# Patient Record
Sex: Female | Born: 1935 | Race: Black or African American | Hispanic: No | Smoking: Former smoker
Health system: Southern US, Community
[De-identification: ages and names within clinical notes are randomized; demographics above are authoritative.]

## PROBLEM LIST (undated history)

## (undated) DIAGNOSIS — I10 Essential (primary) hypertension: Secondary | ICD-10-CM

## (undated) DIAGNOSIS — H25019 Cortical age-related cataract, unspecified eye: Secondary | ICD-10-CM

## (undated) DIAGNOSIS — R2681 Unsteadiness on feet: Secondary | ICD-10-CM

## (undated) DIAGNOSIS — Z860100 Personal history of colon polyps, unspecified: Secondary | ICD-10-CM

## (undated) DIAGNOSIS — C140 Malignant neoplasm of pharynx, unspecified: Secondary | ICD-10-CM

## (undated) DIAGNOSIS — H9193 Unspecified hearing loss, bilateral: Secondary | ICD-10-CM

## (undated) DIAGNOSIS — M1990 Unspecified osteoarthritis, unspecified site: Secondary | ICD-10-CM

## (undated) DIAGNOSIS — I2510 Atherosclerotic heart disease of native coronary artery without angina pectoris: Secondary | ICD-10-CM

## (undated) DIAGNOSIS — D649 Anemia, unspecified: Secondary | ICD-10-CM

## (undated) DIAGNOSIS — D751 Secondary polycythemia: Secondary | ICD-10-CM

## (undated) HISTORY — DX: Malignant neoplasm of pharynx, unspecified: C14.0

## (undated) HISTORY — DX: Secondary polycythemia: D75.1

## (undated) HISTORY — DX: Anemia, unspecified: D64.9

## (undated) HISTORY — DX: Essential (primary) hypertension: I10

## (undated) HISTORY — PX: JOINT REPLACEMENT: SHX530

## (undated) HISTORY — DX: Unspecified hearing loss, bilateral: H91.93

## (undated) HISTORY — DX: Unspecified osteoarthritis, unspecified site: M19.90

## (undated) HISTORY — DX: Personal history of colonic polyps: Z86.010

## (undated) HISTORY — DX: Unsteadiness on feet: R26.81

## (undated) HISTORY — DX: Atherosclerotic heart disease of native coronary artery without angina pectoris: I25.10

## (undated) HISTORY — DX: Cortical age-related cataract, unspecified eye: H25.019

## (undated) HISTORY — DX: Personal history of colon polyps, unspecified: Z86.0100

---

## 2003-09-17 DIAGNOSIS — C140 Malignant neoplasm of pharynx, unspecified: Secondary | ICD-10-CM

## 2003-09-17 HISTORY — DX: Malignant neoplasm of pharynx, unspecified: C14.0

## 2004-06-16 ENCOUNTER — Ambulatory Visit: Payer: Self-pay | Admitting: Radiation Oncology

## 2006-05-28 ENCOUNTER — Other Ambulatory Visit: Payer: Self-pay

## 2006-05-28 ENCOUNTER — Emergency Department: Payer: Self-pay | Admitting: Emergency Medicine

## 2007-06-19 ENCOUNTER — Ambulatory Visit: Payer: Self-pay | Admitting: Otolaryngology

## 2007-06-24 ENCOUNTER — Ambulatory Visit: Payer: Self-pay | Admitting: Otolaryngology

## 2012-09-22 ENCOUNTER — Ambulatory Visit: Payer: Self-pay | Admitting: Otolaryngology

## 2012-12-18 ENCOUNTER — Ambulatory Visit: Payer: Self-pay | Admitting: Family Medicine

## 2012-12-31 ENCOUNTER — Ambulatory Visit: Payer: Self-pay | Admitting: Family Medicine

## 2013-07-20 ENCOUNTER — Ambulatory Visit: Payer: Self-pay | Admitting: Surgery

## 2014-04-06 ENCOUNTER — Emergency Department: Payer: Self-pay | Admitting: Emergency Medicine

## 2014-04-06 LAB — BASIC METABOLIC PANEL
Anion Gap: 9 (ref 7–16)
BUN: 9 mg/dL (ref 7–18)
CHLORIDE: 102 mmol/L (ref 98–107)
Calcium, Total: 8.3 mg/dL — ABNORMAL LOW (ref 8.5–10.1)
Co2: 28 mmol/L (ref 21–32)
Creatinine: 0.84 mg/dL (ref 0.60–1.30)
EGFR (Non-African Amer.): >60
GLUCOSE: 114 mg/dL — AB (ref 65–99)
OSMOLALITY: 277 (ref 275–301)
Potassium: 3.2 mmol/L — ABNORMAL LOW (ref 3.5–5.1)
Sodium: 139 mmol/L (ref 136–145)

## 2014-04-06 LAB — CBC
HCT: 44.4 % (ref 35.0–47.0)
HGB: 14.3 g/dL (ref 12.0–16.0)
MCH: 30 pg (ref 26.0–34.0)
MCHC: 32.1 g/dL (ref 32.0–36.0)
MCV: 93 fL (ref 80–100)
Platelet: 191 10*3/uL (ref 150–440)
RBC: 4.76 10*6/uL (ref 3.80–5.20)
RDW: 14.9 % — AB (ref 11.5–14.5)
WBC: 7.1 10*3/uL (ref 3.6–11.0)

## 2014-04-06 LAB — TROPONIN I: Troponin-I: <0.02 ng/mL

## 2015-06-28 ENCOUNTER — Encounter: Payer: Self-pay | Admitting: *Deleted

## 2015-06-29 ENCOUNTER — Encounter: Payer: Self-pay | Admitting: *Deleted

## 2015-06-29 ENCOUNTER — Inpatient Hospital Stay: Payer: Medicare Other

## 2015-06-29 ENCOUNTER — Inpatient Hospital Stay: Payer: Medicare Other | Attending: Internal Medicine | Admitting: Internal Medicine

## 2015-06-29 VITALS — BP 141/83 | HR 80 | Ht 63.0 in | Wt 122.4 lb

## 2015-06-29 DIAGNOSIS — D751 Secondary polycythemia: Secondary | ICD-10-CM | POA: Insufficient documentation

## 2015-06-29 DIAGNOSIS — Z7982 Long term (current) use of aspirin: Secondary | ICD-10-CM | POA: Insufficient documentation

## 2015-06-29 DIAGNOSIS — Z85819 Personal history of malignant neoplasm of unspecified site of lip, oral cavity, and pharynx: Secondary | ICD-10-CM | POA: Diagnosis not present

## 2015-06-29 DIAGNOSIS — Z86010 Personal history of colonic polyps: Secondary | ICD-10-CM | POA: Insufficient documentation

## 2015-06-29 DIAGNOSIS — I2510 Atherosclerotic heart disease of native coronary artery without angina pectoris: Secondary | ICD-10-CM | POA: Insufficient documentation

## 2015-06-29 DIAGNOSIS — I10 Essential (primary) hypertension: Secondary | ICD-10-CM | POA: Diagnosis not present

## 2015-06-29 DIAGNOSIS — Z79899 Other long term (current) drug therapy: Secondary | ICD-10-CM | POA: Diagnosis not present

## 2015-06-29 LAB — CBC WITH DIFFERENTIAL/PLATELET
BASOS ABS: 0.1 10*3/uL (ref 0–0.1)
BASOS PCT: 1 %
EOS PCT: 1 %
Eosinophils Absolute: 0 10*3/uL (ref 0–0.7)
HCT: 51.5 % — ABNORMAL HIGH (ref 35.0–47.0)
Hemoglobin: 17.3 g/dL — ABNORMAL HIGH (ref 12.0–16.0)
LYMPHS PCT: 13 %
Lymphs Abs: 1 10*3/uL (ref 1.0–3.6)
MCH: 31.1 pg (ref 26.0–34.0)
MCHC: 33.6 g/dL (ref 32.0–36.0)
MCV: 92.7 fL (ref 80.0–100.0)
MONO ABS: 0.5 10*3/uL (ref 0.2–0.9)
Monocytes Relative: 6 %
NEUTROS ABS: 6.1 10*3/uL (ref 1.4–6.5)
Neutrophils Relative %: 79 %
PLATELETS: 177 10*3/uL (ref 150–440)
RBC: 5.55 MIL/uL — AB (ref 3.80–5.20)
RDW: 14.7 % — AB (ref 11.5–14.5)
WBC: 7.6 10*3/uL (ref 3.6–11.0)

## 2015-06-29 LAB — COMPREHENSIVE METABOLIC PANEL
ALBUMIN: 3.7 g/dL (ref 3.5–5.0)
ALT: 11 U/L — AB (ref 14–54)
AST: 19 U/L (ref 15–41)
Alkaline Phosphatase: 49 U/L (ref 38–126)
Anion gap: 6 (ref 5–15)
BUN: 8 mg/dL (ref 6–20)
CHLORIDE: 100 mmol/L — AB (ref 101–111)
CO2: 29 mmol/L (ref 22–32)
CREATININE: 0.8 mg/dL (ref 0.44–1.00)
Calcium: 8.2 mg/dL — ABNORMAL LOW (ref 8.9–10.3)
GFR calc Af Amer: >60 mL/min (ref >60)
GFR calc non Af Amer: >60 mL/min (ref >60)
Glucose, Bld: 99 mg/dL (ref 65–99)
POTASSIUM: 3.4 mmol/L — AB (ref 3.5–5.1)
Sodium: 135 mmol/L (ref 135–145)
Total Bilirubin: 2 mg/dL — ABNORMAL HIGH (ref 0.3–1.2)
Total Protein: 7.1 g/dL (ref 6.5–8.1)

## 2015-06-29 LAB — LACTATE DEHYDROGENASE: LDH: 187 U/L (ref 98–192)

## 2015-06-29 NOTE — Progress Notes (Signed)
Patient has no complaints except urinary frequency.

## 2015-06-29 NOTE — Progress Notes (Signed)
[facility] CONSULT NOTE  Patient Care Team: [name], MD as PCP - General (Family Medicine)  CHIEF COMPLAINTS/PURPOSE OF CONSULTATION:  Polycythemia  HISTORY OF PRESENTING ILLNESS:  [name] 81 y.o.  female with a history of arthritis; prior history of throat cancer 2005 who has limited mobility has been referred to [place] for further evaluation of elevated hemoglobin/hematocrit noted incidentally on labs.  Patient is fair historian at best given her age. Her daughter gives most of the history.  Patient does not have any unusual headaches no unusual vision changes or symptoms of congestive heart failure etc. Patient never had any blood clots or strokes in the past. As per the daughter she does not snore/does not have symptoms of SLEEP apnea. She currently does not smoke.  ROS: A complete 10 point review of system is done which is negative except mentioned above in history of present illness  MEDICAL HISTORY:  Past Medical History  Diagnosis Date  . Anemia   . Arthritis   . Cataract cortical, senile   . CVD (cardiovascular disease)   . Hearing loss of both ears     wears hearing aides  . Hypertension   . History of colonic polyps   . Throat cancer ([place]) 2005  . Unsteady gait     SURGICAL HISTORY: Past Surgical History  Procedure Laterality Date  . Joint replacement Left     11-12 years ago secondary to some from children    SOCIAL HISTORY: Social History   Social History  . Marital Status: Widowed    Spouse Name: N/A  . Number of Children: N/A  . Years of Education: N/A   Occupational History  . Not on file.   Social History Main Topics  . Smoking status: Not on file  . Smokeless tobacco: Not on file  . Alcohol Use: Not on file  . Drug Use: Not on file  . Sexual Activity: Not on file   Other Topics Concern  . Not on file   Social History Narrative    FAMILY HISTORY: Family History  Problem Relation Age of Onset  . Hypertension  Mother   . Hypertension Other   . Diabetes Mellitus II    . Cancer      ALLERGIES:  is allergic to atorvastatin.  MEDICATIONS:  Current Outpatient Prescriptions  Medication Sig Dispense Refill  . amLODipine (NORVASC) 10 MG tablet Take by mouth.    Marland Kitchen aspirin EC 325 MG tablet Take by mouth.    . hydrochlorothiazide (HYDRODIURIL) 25 MG tablet Take by mouth.    . pravastatin (PRAVACHOL) 40 MG tablet Take by mouth.     No current facility-administered medications for this visit.      Marland Kitchen  PHYSICAL EXAMINATION: ECOG PERFORMANCE STATUS: 0 - Asymptomatic  Filed Vitals:   06/29/15 0902  BP: 141/83  Pulse: 80   Filed Weights   06/29/15 0902  Weight: 122 lb 5.7 oz (55.5 kg)    GENERAL: Well-nourished well-developed; Alert, no distress and comfortable.  Accompanied by her daughter. She is in a wheelchair. She was examined in the future hence exams limited. EYES: no pallor or icterus OROPHARYNX: poor dentition. NECK: supple, no masses felt LYMPH:  no palpable lymphadenopathy in the cervical, axillary or inguinal regions LUNGS: clear to auscultation and  No wheeze or crackles HEART/CVS: regular rate & rhythm and no murmurs; No lower extremity edema ABDOMEN: abdomen soft, non-tender and normal bowel sounds Musculoskeletal:no cyanosis of digits and no clubbing  PSYCH: alert & oriented x 3 with fluent speech NEURO: no focal motor/sensory deficits SKIN:  no rashes or significant lesions  LABORATORY DATA:  I have reviewed the data as listed Lab Results  Component Value Date   WBC 7.1 04/06/2014   HGB 14.3 04/06/2014   HCT 44.4 04/06/2014   MCV 93 04/06/2014   PLT 191 04/06/2014   No results for input(s): NA, K, CL, CO2, GLUCOSE, BUN, CREATININE, CALCIUM, GFRNONAA, GFRAA, PROT, ALBUMIN, AST, ALT, ALKPHOS, BILITOT, BILIDIR, IBILI in the last 8760 hours.  RADIOGRAPHIC STUDIES: I have personally reviewed the radiological images as listed and agreed with the findings in the  report. No results found.  ASSESSMENT & PLAN:   # Polycythemia- hemoglobin 17/hematocrit 51. Asymptomatic at this time. .Check CBC CMP and LDH; check jack 2 mutation; erythropoietin levels. Discussed with the patient/daughter that significant elevated hematocrit hemoglobin can cause strokes/congestive heart failure; which tends to happen mostly in primary polycythemia vera. Also discussed the phlebotomy would be recommended if she continues to have elevated hemoglobin/hematocrit.  All questions were answered. The patient knows to call the clinic with any problems, questions or concerns.   I spent 30 minutes counseling the patient face to face. The total time spent in the appointment was 40 minutes and more than 50% was on counseling.     Cammie Sickle, MD 06/29/2015 9:20 AM

## 2015-07-13 LAB — JAK2  V617F QUAL. WITH REFLEX TO EXON 12

## 2015-07-13 LAB — JAK2 EXON 12 MUTATION ANALYSIS

## 2015-07-27 ENCOUNTER — Other Ambulatory Visit: Payer: Self-pay | Admitting: *Deleted

## 2015-07-27 ENCOUNTER — Inpatient Hospital Stay: Payer: Medicare Other

## 2015-07-27 ENCOUNTER — Inpatient Hospital Stay (HOSPITAL_BASED_OUTPATIENT_CLINIC_OR_DEPARTMENT_OTHER): Payer: Medicare Other | Admitting: Internal Medicine

## 2015-07-27 ENCOUNTER — Encounter: Payer: Self-pay | Admitting: Internal Medicine

## 2015-07-27 ENCOUNTER — Inpatient Hospital Stay: Payer: Medicare Other | Attending: Internal Medicine

## 2015-07-27 VITALS — BP 128/82 | HR 66 | Temp 96.0°F | Resp 18 | Ht 63.0 in | Wt 122.1 lb

## 2015-07-27 DIAGNOSIS — Z86010 Personal history of colonic polyps: Secondary | ICD-10-CM | POA: Insufficient documentation

## 2015-07-27 DIAGNOSIS — Z79899 Other long term (current) drug therapy: Secondary | ICD-10-CM

## 2015-07-27 DIAGNOSIS — Z85819 Personal history of malignant neoplasm of unspecified site of lip, oral cavity, and pharynx: Secondary | ICD-10-CM

## 2015-07-27 DIAGNOSIS — D751 Secondary polycythemia: Secondary | ICD-10-CM

## 2015-07-27 DIAGNOSIS — I2510 Atherosclerotic heart disease of native coronary artery without angina pectoris: Secondary | ICD-10-CM | POA: Diagnosis not present

## 2015-07-27 DIAGNOSIS — Z7982 Long term (current) use of aspirin: Secondary | ICD-10-CM | POA: Insufficient documentation

## 2015-07-27 DIAGNOSIS — M1990 Unspecified osteoarthritis, unspecified site: Secondary | ICD-10-CM | POA: Insufficient documentation

## 2015-07-27 DIAGNOSIS — Z87891 Personal history of nicotine dependence: Secondary | ICD-10-CM

## 2015-07-27 DIAGNOSIS — D45 Polycythemia vera: Secondary | ICD-10-CM | POA: Insufficient documentation

## 2015-07-27 DIAGNOSIS — I10 Essential (primary) hypertension: Secondary | ICD-10-CM | POA: Insufficient documentation

## 2015-07-27 NOTE — Progress Notes (Signed)
She has not had a stroke or TIA since her last visit.  She is HOH and daughter has to speak to her.

## 2015-07-27 NOTE — Progress Notes (Signed)
[facility] OFFICE PROGRESS NOTE  Patient Care Team: [name] Pink, MD as PCP - General (Family Medicine)   SUMMARY OF ONCOLOGIC HISTORY:  # OCT 2016- ERYTHROCYTOSIS- [Hb 17 & hct-51; incidental] JAK-2 exon 14 & 12-NEG  # Hard of hearing/debilitating arthritis wheelchair-bound   INTERVAL HISTORY: Given her age/hard of hearing- patient is a poor historian   81 year old female patient with above history of erythrocytosis is here accompanied by her daughter to review the lab results further workup/management.  As per the daughter no new symptoms since last visit.  REVIEW OF SYSTEMS:   cannot be assessed because of patient being a poor historian.   PAST MEDICAL HISTORY :  Past Medical History  Diagnosis Date  . Anemia   . Arthritis   . Cataract cortical, senile   . CVD (cardiovascular disease)   . Hearing loss of both ears     wears hearing aides  . Hypertension   . History of colonic polyps   . Throat cancer ([place]) 2005  . Unsteady gait   . Polycythemia     PAST SURGICAL HISTORY :   Past Surgical History  Procedure Laterality Date  . Joint replacement Left     11-12 years ago secondary to some from children    FAMILY HISTORY :   Family History  Problem Relation Age of Onset  . Hypertension Mother   . Hypertension Sister   . Diabetes Mellitus II Sister     SOCIAL HISTORY:   Social History  Substance Use Topics  . Smoking status: Former Smoker -- 1.00 packs/day for 50 years    Types: Cigarettes    Quit date: 09/17/2003  . Smokeless tobacco: Current User    Types: Snuff  . Alcohol Use: 0.0 oz/week    0 Standard drinks or equivalent per week     Comment: "beer"    ALLERGIES:  is allergic to atorvastatin.  MEDICATIONS:  Current Outpatient Prescriptions  Medication Sig Dispense Refill  . amLODipine (NORVASC) 10 MG tablet Take by mouth.    Marland Kitchen aspirin EC 325 MG tablet Take by mouth.    . hydrochlorothiazide (HYDRODIURIL) 25 MG tablet Take by  mouth.    . pravastatin (PRAVACHOL) 40 MG tablet Take by mouth.     No current facility-administered medications for this visit.    PHYSICAL EXAMINATION: ECOG PERFORMANCE STATUS: 2 - Symptomatic, <50% confined to bed  BP 128/82 mmHg  Pulse 66  Temp(Src) 96 F (35.6 C) (Tympanic)  Resp 18  Ht '5\' 3"'$  (1.6 m)  Wt 122 lb 2.2 oz (55.4 kg)  BMI 21.64 kg/m2  Filed Weights   07/27/15 1344  Weight: 122 lb 2.2 oz (55.4 kg)    GENERAL: Cachectic frail appearing female patient in a wheelchair. Accompanied by daughter.   LABORATORY DATA:  I have reviewed the data as listed    Component Value Date/Time   NA 135 06/29/2015 0955   NA 139 04/06/2014 1659   K 3.4* 06/29/2015 0955   K 3.2* 04/06/2014 1659   CL 100* 06/29/2015 0955   CL 102 04/06/2014 1659   CO2 29 06/29/2015 0955   CO2 28 04/06/2014 1659   GLUCOSE 99 06/29/2015 0955   GLUCOSE 114* 04/06/2014 1659   BUN 8 06/29/2015 0955   BUN 9 04/06/2014 1659   CREATININE 0.80 06/29/2015 0955   CREATININE 0.84 04/06/2014 1659   CALCIUM 8.2* 06/29/2015 0955   CALCIUM 8.3* 04/06/2014 1659   PROT 7.1 06/29/2015 0955  ALBUMIN 3.7 06/29/2015 0955   AST 19 06/29/2015 0955   ALT 11* 06/29/2015 0955   ALKPHOS 49 06/29/2015 0955   BILITOT 2.0* 06/29/2015 0955   GFRNONAA >60 06/29/2015 0955   GFRNONAA >60 04/06/2014 1659   GFRAA >60 06/29/2015 0955   GFRAA >60 04/06/2014 1659    No results found for: SPEP, UPEP  Lab Results  Component Value Date   WBC 7.6 06/29/2015   NEUTROABS 6.1 06/29/2015   HGB 17.3* 06/29/2015   HCT 51.5* 06/29/2015   MCV 92.7 06/29/2015   PLT 177 06/29/2015      Chemistry      Component Value Date/Time   NA 135 06/29/2015 0955   NA 139 04/06/2014 1659   K 3.4* 06/29/2015 0955   K 3.2* 04/06/2014 1659   CL 100* 06/29/2015 0955   CL 102 04/06/2014 1659   CO2 29 06/29/2015 0955   CO2 28 04/06/2014 1659   BUN 8 06/29/2015 0955   BUN 9 04/06/2014 1659   CREATININE 0.80 06/29/2015 0955    CREATININE 0.84 04/06/2014 1659      Component Value Date/Time   CALCIUM 8.2* 06/29/2015 0955   CALCIUM 8.3* 04/06/2014 1659   ALKPHOS 49 06/29/2015 0955   AST 19 06/29/2015 0955   ALT 11* 06/29/2015 0955   BILITOT 2.0* 06/29/2015 0955       RADIOGRAPHIC STUDIES: I have personally reviewed the radiological images as listed and agreed with the findings in the report. No results found.   ASSESSMENT & PLAN:   # ISOLATED ERYTHROCYTOSIS-/secondary erythrocytosis- question etiology. [name] 2 negative. Erythropoietin/Carbon monoxide pending. Patient's hemoglobin is 17 hematocrit 51 asymptomatic.  Patient is fairly nervous/reluctant coming to the cancer center frequently. After a long discussion with the patient's daughter//patient preference- it was decided not to proceed with any phlebotomy; or any other follow-ups in the [place]. I think at her advanced age/poor performance status I think is very reasonable.   Orders Placed This Encounter  Procedures  . Carbon Monoxide, Blood    Standing Status: Future     Number of Occurrences: 1     Standing Expiration Date: 07/26/2016   All questions were answered. The patient knows to call the clinic with any problems, questions or concerns. No barriers to learning was detected. I spent 15 minutes counseling the patient face to face. The total time spent in the appointment was 30 minutes and more than 50% was on counseling and review of test results     [name] Sickle, MD 07/27/2015 2:03 PM

## 2015-07-28 LAB — CARBON MONOXIDE, BLOOD (PERFORMED AT REF LAB): Carbon Monoxide, Blood: 3.7 % — ABNORMAL HIGH (ref 0.0–1.9)

## 2015-07-28 LAB — ERYTHROPOIETIN: Erythropoietin: 7.8 m[IU]/mL (ref 2.6–18.5)

## 2015-08-31 ENCOUNTER — Emergency Department
Admission: EM | Admit: 2015-08-31 | Discharge: 2015-08-31 | Disposition: A | Discharge status: Home / Self Care | Payer: Medicare Other | Attending: Emergency Medicine | Admitting: Emergency Medicine

## 2015-08-31 ENCOUNTER — Emergency Department: Payer: Medicare Other

## 2015-08-31 ENCOUNTER — Encounter: Payer: Self-pay | Admitting: *Deleted

## 2015-08-31 DIAGNOSIS — Z7982 Long term (current) use of aspirin: Secondary | ICD-10-CM | POA: Insufficient documentation

## 2015-08-31 DIAGNOSIS — I10 Essential (primary) hypertension: Secondary | ICD-10-CM | POA: Insufficient documentation

## 2015-08-31 DIAGNOSIS — Y92009 Unspecified place in unspecified non-institutional (private) residence as the place of occurrence of the external cause: Secondary | ICD-10-CM | POA: Insufficient documentation

## 2015-08-31 DIAGNOSIS — Y9389 Activity, other specified: Secondary | ICD-10-CM | POA: Diagnosis not present

## 2015-08-31 DIAGNOSIS — Z87891 Personal history of nicotine dependence: Secondary | ICD-10-CM | POA: Diagnosis not present

## 2015-08-31 DIAGNOSIS — Z79899 Other long term (current) drug therapy: Secondary | ICD-10-CM | POA: Insufficient documentation

## 2015-08-31 DIAGNOSIS — S8992XA Unspecified injury of left lower leg, initial encounter: Secondary | ICD-10-CM | POA: Diagnosis present

## 2015-08-31 DIAGNOSIS — W208XXA Other cause of strike by thrown, projected or falling object, initial encounter: Secondary | ICD-10-CM | POA: Insufficient documentation

## 2015-08-31 DIAGNOSIS — S8012XA Contusion of left lower leg, initial encounter: Secondary | ICD-10-CM | POA: Insufficient documentation

## 2015-08-31 DIAGNOSIS — Y998 Other external cause status: Secondary | ICD-10-CM | POA: Diagnosis not present

## 2015-08-31 MED ORDER — TRAMADOL HCL 50 MG PO TABS: 50.0000 mg | ORAL_TABLET | Freq: Four times a day (QID) | ORAL | Status: DC | PRN

## 2015-08-31 NOTE — ED Notes (Signed)
Pt arrived via EMS from home after a large screen TV fell on left leg. Pt is able to move foot and EMS reports pt could stand and bear small amount of weight on left leg. Left leg is swollen below the knee but pedal pulse is present and equal bilaterally. Pts family denies LOC or trauma to head.

## 2015-08-31 NOTE — ED Provider Notes (Signed)
Time Seen: Approximately 1630  I have reviewed the triage notes  Chief Complaint: Leg Injury   History of Present Illness: [name] is a 81 y.o. female who apparently had at home a large screen TV fall on her left leg. Patient herself is not sure exactly how the TV fell off the stand but landed primarily on her left leg and knocked her to the floor. She is complaining of pain on the lateral surface of her left leg. She denies any head trauma or loss of consciousness. Her daughter states that she has been doing well prior to the injury at home .Marland Kitchen Patient was able to bear weight but had increased pain and swelling hence their transport by EMS here to the hospital.   Past Medical History  Diagnosis Date  . Anemia   . Arthritis   . Cataract cortical, senile   . CVD (cardiovascular disease)   . Hearing loss of both ears     wears hearing aides  . Hypertension   . History of colonic polyps   . Throat cancer ([place]) 2005  . Unsteady gait   . Polycythemia     There are no active problems to display for this patient.   Past Surgical History  Procedure Laterality Date  . Joint replacement Left     11-12 years ago secondary to some from children    Past Surgical History  Procedure Laterality Date  . Joint replacement Left     11-12 years ago secondary to some from children    Current Outpatient Rx  Name  Route  Sig  Dispense  Refill  . amLODipine (NORVASC) 10 MG tablet   Oral   Take by mouth.         Marland Kitchen aspirin EC 325 MG tablet   Oral   Take by mouth.         . hydrochlorothiazide (HYDRODIURIL) 25 MG tablet   Oral   Take by mouth.         . pravastatin (PRAVACHOL) 40 MG tablet   Oral   Take by mouth.         . traMADol (ULTRAM) 50 MG tablet   Oral   Take 1 tablet (50 mg total) by mouth every 6 (six) hours as needed.   20 tablet   0     Allergies:  Atorvastatin  Family History: Family History  Problem Relation Age of Onset  . Hypertension  Mother   . Hypertension Sister   . Diabetes Mellitus II Sister     Social History: Social History  Substance Use Topics  . Smoking status: Former Smoker -- 1.00 packs/day for 50 years    Types: Cigarettes    Quit date: 09/17/2003  . Smokeless tobacco: Current User    Types: Snuff  . Alcohol Use: 3.0 oz/week    0 Standard drinks or equivalent, 5 Cans of beer per week     Comment: "beer"     Review of Systems:  Review of systems mainly acquired. Daughter essentially no other physical complaints Constitutional: No fever Eyes: No visual disturbances no head trauma ENT: No sore throat, ear pain Cardiac: No chest pain Respiratory: No shortness of breath, wheezing, or stridor Abdomen: No abdominal pain, no vomiting, No diarrhea Extremities: On the left lower extremity pain Skin: No rashes, easy bruising Neurologic: No focal weakness, trouble with speech or swollowing Urologic: No dysuria, Hematuria, or urinary frequency   Physical Exam:  ED Triage Vitals  Enc Vitals Group     BP 08/31/15 1613 134/81 mmHg     Pulse Rate 08/31/15 1613 78     Resp --      Temp 08/31/15 1613 98.3 F (36.8 C)     Temp Source 08/31/15 1613 Oral     SpO2 08/31/15 1613 99 %     Weight 08/31/15 1613 125 lb (56.7 kg)     Height 08/31/15 1613 '5\' 6"'$  (1.676 m)     Head Cir --      Peak Flow --      Pain Score --      Pain Loc --      Pain Edu? --      Excl. in [place]? --     General: Awake , Alert , and Oriented times 3; GCS 15 Head: Normal cephalic , atraumatic Eyes: Pupils equal , round, reactive to light Nose/Throat: No nasal drainage, patent upper airway without erythema or exudate.  Neck: Supple, Full range of motion, No anterior adenopathy or palpable thyroid masses Lungs: Clear to ascultation without wheezes , rhonchi, or rales Heart: Regular rate, regular rhythm without murmurs , gallops , or rubs Abdomen: Soft, non tender without rebound, guarding , or rigidity; bowel sounds positive and  symmetric in all 4 quadrants. No organomegaly .        Extremities: Patient has swelling with some mild ecchymosis on the lateral surface of the left lower extremity. She has good plantar flexion extension and has no knee pain or swelling. He is stable to Lachman testing. Negative posterior knee discomfort with good 2+ dorsalis pedis and posterior tibial pulses. Ankle shows normal alignment and no tenderness or ecchymosis noted Neurologic: normal ambulation, Motor symmetric without deficits, sensory intact Skin: warm, dry, no rashes    Radiology:     EXAM: LEFT TIBIA AND FIBULA - 2 VIEW  COMPARISON: None  FINDINGS: Diffuse soft tissue swelling.  Bones demineralized.  Tricompartmental osteoarthritic changes of the LEFT knee.  Ankle joint alignment normal.  Bipartite patella.  No acute fracture, dislocation or bone destruction.  Few scattered atherosclerotic calcifications at distal superficial femoral and popliteal arteries.  IMPRESSION: Osseous demineralization with osteoarthritic changes LEFT knee.  Soft tissue swelling without acute bony abnormalities.  I personally reviewed the radiologic studies    ED Course: * Patient's stay was uneventful and does not appear to have any bony injuries from her trauma today. Patient will be prescribed outpatient pain medication and advised take Tylenol over-the-counter. Rest, ice, and elevate the left leg.   Assessment:  Left lower extremity contusion   Final Clinical Impression:   Final diagnoses:  Contusion of leg, left, initial encounter     Plan:  Outpatient management Patient was advised to return immediately if condition worsens. Patient was advised to follow up with their primary care physician or other specialized physicians involved in their outpatient care             Daymon Larsen, MD 08/31/15 [phone]

## 2015-08-31 NOTE — Discharge Instructions (Signed)
Contusion A contusion is a deep bruise. Contusions are the result of a blunt injury to tissues and muscle fibers under the skin. The injury causes bleeding under the skin. The skin overlying the contusion may turn blue, purple, or yellow. Minor injuries will give you a painless contusion, but more severe contusions may stay painful and swollen for a few weeks.  CAUSES  This condition is usually caused by a blow, trauma, or direct force to an area of the body. SYMPTOMS  Symptoms of this condition include:  Swelling of the injured area.  Pain and tenderness in the injured area.  Discoloration. The area may have redness and then turn blue, purple, or yellow. DIAGNOSIS  This condition is diagnosed based on a physical exam and medical history. An X-ray, CT scan, or MRI may be needed to determine if there are any associated injuries, such as broken bones (fractures). TREATMENT  Specific treatment for this condition depends on what area of the body was injured. In general, the best treatment for a contusion is resting, icing, applying pressure to (compression), and elevating the injured area. This is often called the RICE strategy. Over-the-counter anti-inflammatory medicines may also be recommended for pain control.  HOME CARE INSTRUCTIONS   Rest the injured area.  If directed, apply ice to the injured area:  Put ice in a plastic bag.  Place a towel between your skin and the bag.  Leave the ice on for 20 minutes, 2-3 times per day.  If directed, apply light compression to the injured area using an elastic bandage. Make sure the bandage is not wrapped too tightly. Remove and reapply the bandage as directed by your health care provider.  If possible, raise (elevate) the injured area above the level of your heart while you are sitting or lying down.  Take over-the-counter and prescription medicines only as told by your health care provider. SEEK MEDICAL CARE IF:  Your symptoms do not  improve after several days of treatment.  Your symptoms get worse.  You have difficulty moving the injured area. SEEK IMMEDIATE MEDICAL CARE IF:   You have severe pain.  You have numbness in a hand or foot.  Your hand or foot turns pale or cold.   This information is not intended to replace advice given to you by your health care provider. Make sure you discuss any questions you have with your health care provider.   Document Released: 06/12/2005 Document Revised: 05/24/2015 Document Reviewed: 01/18/2015 Elsevier Interactive Patient Education [employer].  Please return immediately if condition worsens. Please contact her primary physician or the physician you were given for referral. If you have any specialist physicians involved in her treatment and plan please also contact them. Thank you for using [facility].

## 2015-10-03 ENCOUNTER — Other Ambulatory Visit: Payer: Self-pay | Admitting: Surgery

## 2015-10-03 ENCOUNTER — Encounter: Payer: Medicare Other | Attending: Surgery | Admitting: Surgery

## 2015-10-03 DIAGNOSIS — L97222 Non-pressure chronic ulcer of left calf with fat layer exposed: Secondary | ICD-10-CM | POA: Insufficient documentation

## 2015-10-03 DIAGNOSIS — M1990 Unspecified osteoarthritis, unspecified site: Secondary | ICD-10-CM | POA: Diagnosis not present

## 2015-10-03 DIAGNOSIS — H9190 Unspecified hearing loss, unspecified ear: Secondary | ICD-10-CM | POA: Insufficient documentation

## 2015-10-03 DIAGNOSIS — D649 Anemia, unspecified: Secondary | ICD-10-CM | POA: Diagnosis not present

## 2015-10-03 DIAGNOSIS — Z85819 Personal history of malignant neoplasm of unspecified site of lip, oral cavity, and pharynx: Secondary | ICD-10-CM | POA: Insufficient documentation

## 2015-10-03 DIAGNOSIS — Z86010 Personal history of colonic polyps: Secondary | ICD-10-CM | POA: Diagnosis not present

## 2015-10-03 DIAGNOSIS — M7981 Nontraumatic hematoma of soft tissue: Secondary | ICD-10-CM | POA: Diagnosis present

## 2015-10-03 DIAGNOSIS — I2510 Atherosclerotic heart disease of native coronary artery without angina pectoris: Secondary | ICD-10-CM | POA: Diagnosis not present

## 2015-10-03 DIAGNOSIS — I10 Essential (primary) hypertension: Secondary | ICD-10-CM | POA: Insufficient documentation

## 2015-10-03 DIAGNOSIS — Y29XXXA Contact with blunt object, undetermined intent, initial encounter: Secondary | ICD-10-CM | POA: Diagnosis not present

## 2015-10-03 DIAGNOSIS — T148XXA Other injury of unspecified body region, initial encounter: Secondary | ICD-10-CM

## 2015-10-04 NOTE — Progress Notes (Signed)
[name] [name] ([id number]) Visit Report for 10/03/2015 Abuse/Suicide Risk Screen Details Patient Name: [name] [name]. Date of Service: 10/03/2015 2:30 PM Medical Record Patient Account Number: [id number] Number: Afful, RN, BSN, Treating RN: 09/12/1925 (82 y.o. [name] [name] Date of Birth/Sex: Female) Other Clinician: Primary Care Physician: [name] Treating Christin Fudge Referring Physician: [name] Physician/Extender: Suella Grove in Treatment: 0 Abuse/Suicide Risk Screen Items Answer ABUSE/SUICIDE RISK SCREEN: Has anyone close to you tried to hurt or harm you recentlyo No Do you feel uncomfortable with anyone in your familyo No Has anyone forced you do things that you didnot want to doo No Do you have any thoughts of harming yourselfo No Patient displays signs or symptoms of abuse and/or neglect. No Electronic Signature(s) Signed: 10/03/2015 5:00:07 PM By: Regan Lemming BSN, RN Entered By: Regan Lemming on 10/03/2015 32:95:18 [name] [name] ([id number]) -------------------------------------------------------------------------------- Activities of Daily Living Details Patient Name: [name] [name] V. Date of Service: 10/03/2015 2:30 PM Medical Record Patient Account Number: [id number] Number: Afful, RN, BSN, Treating RN: 11-Nov-1924 (82 y.o. [name] [name] Date of Birth/Sex: Female) Other Clinician: Primary Care Physician: [name] Treating Christin Fudge Referring Physician: [name] Physician/Extender: Suella Grove in Treatment: 0 Activities of Daily Living Items Answer Activities of Daily Living (Please select one for each item) Drive Automobile Need Assistance Take Medications Need Assistance Use Telephone Need Assistance Care for Appearance Need Assistance Use Toilet Need Assistance Bath / Shower Need Assistance Dress Self Need Assistance Feed Self Need Assistance Walk Need Assistance Get In / Out Bed Need Assistance Housework Need  Assistance Prepare Meals Need Assistance Handle Money Need Assistance Shop for Self Need Assistance Electronic Signature(s) Signed: 10/03/2015 5:00:07 PM By: Regan Lemming BSN, RN Entered By: Regan Lemming on 10/03/2015 55:73:22 [name] [name] ([id number]) -------------------------------------------------------------------------------- Education Assessment Details Patient Name: [name] [name]. Date of Service: 10/03/2015 2:30 PM Medical Record Patient Account Number: [id number] Number: Afful, RN, BSN, Treating RN: 06/09/1925 (82 y.o. [name] [name] Date of Birth/Sex: Female) Other Clinician: Primary Care Physician: [name] Treating Christin Fudge Referring Physician: [name] Physician/Extender: Suella Grove in Treatment: 0 Primary Learner Assessed: Patient Learning Preferences/Education Level/Primary Language Learning Preference: Explanation Highest Education Level: Grade School Preferred Language: English Cognitive Barrier Assessment/Beliefs Language Barrier: No Physical Barrier Assessment Impaired Vision: Yes Glasses Impaired Hearing: Yes Hearing Aid Decreased Hand dexterity: No Knowledge/Comprehension Assessment Knowledge Level: Low Comprehension Level: Low Ability to understand written Low instructions: Ability to understand verbal Low instructions: Motivation Assessment Anxiety Level: Calm Cooperation: Cooperative Education Importance: Acknowledges Need Interest in Health Problems: Asks Questions Perception: Coherent Willingness to Engage in Self- Low Management Activities: Readiness to Engage in Self- Low Management Activities: Electronic Signature(s) Signed: 10/03/2015 5:00:07 PM By: Regan Lemming BSN, RN Entered By: Regan Lemming on 10/03/2015 76:16:07 [name] [name] ([id number]) Flynn, Aftin Clayton Bibles ([id number]) -------------------------------------------------------------------------------- Fall Risk Assessment Details Patient Name: [name] [name]. Date of Service: 10/03/2015 2:30 PM Medical Record Patient Account Number: [id number] Number: Afful, RN, BSN, Treating RN: 02/13/1925 (82 y.o. [name] [name] Date of Birth/Sex: Female) Other Clinician: Primary Care Physician: [name] Treating Christin Fudge Referring Physician: [name] Physician/Extender: Suella Grove in Treatment: 0 Fall Risk Assessment Items Have you had 2 or more falls in the last 12 monthso 0 No Have you had any fall that resulted in injury in the last 12 monthso 0 No FALL RISK ASSESSMENT: History of falling - immediate or within 3 months 0 No Secondary diagnosis 0 No Ambulatory aid None/bed rest/wheelchair/nurse 0 Yes Crutches/cane/walker 0 No Furniture  0 No IV Access/Saline Lock 0 No Gait/Training Normal/bed rest/immobile 0 Yes Weak 0 No Impaired 0 No Mental Status Oriented to own ability 0 Yes Electronic Signature(s) Signed: 10/03/2015 5:00:07 PM By: Regan Lemming BSN, RN Entered By: Regan Lemming on 10/03/2015 32:20:25 [name] [name] ([id number]) -------------------------------------------------------------------------------- Foot Assessment Details Patient Name: [name] [name] V. Date of Service: 10/03/2015 2:30 PM Medical Record Patient Account Number: [id number] Number: Afful, RN, BSN, Treating RN: May 10, 1925 (82 y.o. [name] [name] Date of Birth/Sex: Female) Other Clinician: Primary Care Physician: [name] Treating Christin Fudge Referring Physician: [name] Physician/Extender: Suella Grove in Treatment: 0 Foot Assessment Items Site Locations + = Sensation present, - = Sensation absent, C = Callus, U = Ulcer R = Redness, W = Warmth, M = Maceration, PU = Pre-ulcerative lesion F = Fissure, S = Swelling, D = Dryness Assessment Right: Left: Other Deformity: No No Prior Foot Ulcer: No No Prior Amputation: No No Charcot Joint: No No Ambulatory Status: Ambulatory With Help Assistance Device: Wheelchair Gait:  Administrator, arts) Signed: 10/03/2015 5:00:07 PM By: Regan Lemming BSN, RN Entered By: Regan Lemming on 10/03/2015 60:73:71 [name] [name] ([id number]) [name] [name] ([id number]) -------------------------------------------------------------------------------- Nutrition Risk Assessment Details Patient Name: [name] [name] V. Date of Service: 10/03/2015 2:30 PM Medical Record Patient Account Number: [id number] Number: Afful, RN, BSN, Treating RN: December 09, 1924 (82 y.o. [name] [name] Date of Birth/Sex: Female) Other Clinician: Primary Care Physician: [name] Treating Christin Fudge Referring Physician: [name] Physician/Extender: Suella Grove in Treatment: 0 Height (in): Weight (lbs): Body Mass Index (BMI): Nutrition Risk Assessment Items NUTRITION RISK SCREEN: I have an illness or condition that made me change the kind and/or 0 No amount of food I eat I eat fewer than two meals per day 0 No I eat few fruits and vegetables, or milk products 0 No I have three or more drinks of beer, liquor or wine almost every day 0 No I have tooth or mouth problems that make it hard for me to eat 0 No I don't always have enough money to buy the food I need 0 No I eat alone most of the time 0 No I take three or more different prescribed or over-the-counter drugs a 0 No day Without wanting to, I have lost or gained 10 pounds in the last six 0 No months I am not always physically able to shop, cook and/or feed myself 0 No Nutrition Protocols Good Risk Protocol 0 No interventions needed Moderate Risk Protocol Electronic Signature(s) Signed: 10/03/2015 5:00:07 PM By: Regan Lemming BSN, RN Entered By: Regan Lemming on 10/03/2015 14:33:16

## 2015-10-04 NOTE — Progress Notes (Signed)
HAMPTON, COST ([id number]) Visit Report for 10/03/2015 Chief Complaint Document Details Patient Name: [name]. Date of Service: 10/03/2015 2:30 PM Medical Record Patient Account Number: [id number] Number: [name], Treating [name]: [birth year] (81 y.o. [name] Date of Birth/Sex: Female) Other Clinician: Primary Care Physician: [name] Treating [name] Referring Physician: [place] [name] Physician/Extender: [name] in Treatment: 0 Information Obtained from: Patient Chief Complaint Patient presents to the wound care center for a consult due non healing wound to the left lower extremity for about a month Electronic Signature(s) Signed: 10/03/2015 3:17:16 PM By: [name] Fudge MD, FACS Entered By: [name] on 10/03/2015 46:27:03 [name], [name] ([id number]) -------------------------------------------------------------------------------- HPI Details Patient Name: Boddy, [name]. Date of Service: 10/03/2015 2:30 PM Medical Record Patient Account Number: [id number] Number: [name], Treating [name]: 01/01/1925 (82 y.o. [name] Date of Birth/Sex: Female) Other Clinician: Primary Care Physician: [name] Treating [name] Referring Physician: [place] [name] Physician/Extender: [name] in Treatment: 0 History of Present Illness Location: large swelling and wound of the left lower extremity Quality: Patient reports experiencing a dull pain to affected area(s). Severity: Patient states wound are getting worse. Duration: Patient has had the wound for < 4 weeks prior to presenting for treatment Timing: Pain in wound is constant (hurts all the time) Context: The wound occurred when the patient had a large heavy blunt object fall on her left leg Modifying Factors: Other treatment(s) tried include: has been on antibiotic ointment locally and orally Associated Signs and Symptoms: Patient reports having increase swelling. HPI Description:  82 year old patient who is known to have a nonhealing wound on the left lower leg has had this for about a month. She has been seen by her PCP and was on doxycycline earlier and now is on Augmentin. She is swelling of the leg and scab over this and the family requested a referral to the wound center. Her past medical history significant for arthritis, anemia, cardiovascular disease, deafness, hypertension, colon polyps and throat cancer. Electronic Signature(s) Signed: 10/03/2015 3:18:32 PM By: [name] Fudge MD, FACS Previous Signature: 10/03/2015 2:30:49 PM Version By: [name] Fudge MD, FACS Entered By: [name] on 10/03/2015 89:38:10 [name], [name] ([id number]) -------------------------------------------------------------------------------- Physical Exam Details Patient Name: [name], [name]. Date of Service: 10/03/2015 2:30 PM Medical Record Patient Account Number: [id number] Number: [name], Treating [name]: 06-12-1925 (82 y.o. [name] Date of Birth/Sex: Female) Other Clinician: Primary Care Physician: [name] Treating [name] Referring Physician: [place] [name] Physician/Extender: [name] in Treatment: 0 Constitutional . Pulse regular. Respirations normal and unlabored. Afebrile. . Eyes Nonicteric. Reactive to light. Ears, Nose, Mouth, and Throat Lips, teeth, and gums WNL.Marland Kitchen Moist mucosa without lesions. Neck supple and nontender. No palpable supraclavicular or cervical adenopathy. Normal sized without goiter. Respiratory WNL. No retractions.. Cardiovascular Pedal Pulses WNL. left ABI could not be measured but the right ABI was 1.1. large hematoma with severe MR left lower extremity. Chest Breasts symmetical and no nipple discharge.. Breast tissue WNL, no masses, lumps, or tenderness.. Gastrointestinal (GI) Abdomen without masses or tenderness.. No liver or spleen enlargement or tenderness.. Lymphatic No adneopathy. No adenopathy. No  adenopathy. Musculoskeletal Adexa without tenderness or enlargement.. Digits and nails w/o clubbing, cyanosis, infection, petechiae, ischemia, or inflammatory conditions.. Integumentary (Hair, Skin) No suspicious lesions. No crepitus or fluctuance. No peri-wound warmth or erythema. No masses.Marland Kitchen Psychiatric Judgement and insight Intact.. No evidence of depression, anxiety, or agitation.. Notes the patient has a dry eschar on the left lateral compartment and  this is the area which is surrounded by a large seroma and hematoma with ecchymosis. No cellulitis. Electronic Signature(s) Signed: 10/03/2015 3:19:47 PM By: [name] Fudge MD, FACS [name], [name] ([id number]) Entered By: [name] on 10/03/2015 35:36:14 [name], [name] ([id number]) -------------------------------------------------------------------------------- Physician Orders Details Patient Name: [name], [name]. Date of Service: 10/03/2015 2:30 PM Medical Record Patient Account Number: [id number] Number: [name], Treating [name]: 06-18-1925 (82 y.o. [name] Date of Birth/Sex: Female) Other Clinician: Primary Care Physician: [name] Treating [name] Referring Physician: Maryland [name] Physician/Extender: [name] in Treatment: 0 Verbal / Phone Orders: Yes Clinician: Afful, [name], [name], [name] Read Back and Verified: Yes Diagnosis Coding Secondary Dressing Wound #1 Left,Lateral Lower Leg o Gauze and Kerlix/Conform Follow-up Appointments Wound #1 Left,Lateral Lower Leg o Return Appointment in 1 week. 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 Date of Service: 10/03/2015 2:30 PM Medical Record Patient Account Number: [id number] Number: [name], Treating [name]: 03-08-1925 (82 y.o. 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 Date of Service: 10/03/2015 2:30 PM Medical Record Patient Account Number: [id number] Number: [name], Treating [name]: 1925/02/22 (82 y.o. [name] Date of Birth/Sex: Female) Other Clinician: Primary Care Physician: [name] Treating [name] Referring Physician: [place] [name] Physician/Extender: [name] in Treatment: 0 Subjective Chief Complaint Information obtained from Patient Patient presents to the wound care center for a consult due non healing wound to the left lower extremity for about a month History of Present Illness (HPI) The following HPI elements were documented for the patient's wound: Location: large swelling and wound of the left lower extremity Quality: Patient reports experiencing a dull pain to affected area(s). Severity: Patient states wound are getting worse. Duration: Patient has had the wound for < 4 weeks prior to presenting  for treatment Timing: Pain in wound is constant (hurts all the time) Context: The wound occurred when the patient had a large heavy blunt object fall on her left leg Modifying Factors: Other treatment(s) tried include: has been on antibiotic ointment locally and orally Associated Signs and Symptoms: Patient reports having increase swelling. 82 year old patient who is known to have a nonhealing wound on the left lower leg has had this for about a month. She has been seen by her PCP and was on doxycycline earlier and now is on Augmentin. She is swelling of the leg and scab over this and the family requested a referral to the wound center. Her past medical history significant for arthritis, anemia, cardiovascular disease, deafness, hypertension, colon polyps and throat cancer. Wound History Patient presents with 1 open wound that has been present for approximately 4 weeks. Patient has been treating wound in the following manner: open to  air. Laboratory tests have not been performed in the last month. Patient reportedly has not tested positive for an antibiotic resistant organism. Patient History Information obtained from Patient. Allergies Lipitor Messmer, [name] ([id number]) Family History Cancer - Siblings, Heart Disease - Child, Hypertension - Child, Kidney Disease - Child, Seizures - Siblings, Child, Stroke - Child, No family history of Diabetes, Hereditary Spherocytosis, Lung Disease, Thyroid Problems, Tuberculosis. Social History Never smoker - "snuff", Marital Status - Widowed, Alcohol Use - Moderate, Drug Use - No History, Caffeine Use - Daily - rea. Medical History Eyes Patient has history of Cataracts - cortical, senile Ear/Nose/Mouth/Throat Denies history of Chronic sinus problems/congestion, Middle ear problems Hematologic/Lymphatic Patient has history of Anemia Denies history of Hemophilia, Human Immunodeficiency Virus, Lymphedema, Sickle Cell Disease Respiratory Denies  history of Aspiration, Chronic Obstructive Pulmonary Disease (COPD), Pneumothorax, Sleep Apnea, Tuberculosis Cardiovascular Patient has history of Coronary Artery Disease, Hypertension Gastrointestinal Denies history of Cirrhosis , Colitis, Crohn s, Hepatitis A, Hepatitis B, Hepatitis C Endocrine Denies history of Type I Diabetes, Type II Diabetes Genitourinary Denies history of End Stage Renal Disease Immunological Denies history of Lupus Erythematosus, Raynaud s, Scleroderma Integumentary (Skin) Denies history of History of Burn, History of pressure wounds Musculoskeletal Patient has history of Osteoarthritis Neurologic Denies history of Dementia, Neuropathy, Quadriplegia, Paraplegia, Seizure Disorder Psychiatric Denies history of Anorexia/bulimia, Confinement Anxiety Medical And Surgical History Notes Cardiovascular stroke Gastrointestinal colonic polyps Oncologic throat cancer in 2005 Review of Systems (ROS) Eyes Complains or has symptoms of Glasses / Contacts. Ear/Nose/Mouth/Throat Ratchford, [name] ([id number]) Complains or has symptoms of Difficult clearing ears - Hearing aids. Hematologic/Lymphatic The patient has no complaints or symptoms. Respiratory The patient has no complaints or symptoms. Cardiovascular Complains or has symptoms of LE edema. Gastrointestinal The patient has no complaints or symptoms. Endocrine The patient has no complaints or symptoms. Genitourinary The patient has no complaints or symptoms. Immunological The patient has no complaints or symptoms. Integumentary (Skin) Complains or has symptoms of Wounds. Denies complaints or symptoms of Bleeding or bruising tendency, Breakdown, Swelling. Musculoskeletal Complains or has symptoms of Muscle Weakness. Neurologic The patient has no complaints or symptoms. Oncologic The patient has no complaints or symptoms. Psychiatric The patient has no complaints or symptoms. Medications aspirin  325 mg tablet oral 1 1 tablet oral pravastatin 40 mg tablet oral 1 1 tablet oral amlodipine 10 mg tablet oral 1 1 tablet oral amoxicillin 875 mg-potassium clavulanate 125 mg tablet oral 1 1 tablet oral hydrochlorothiazide 25 mg tablet oral 1 1 tablet oral Objective Constitutional Pulse regular. Respirations normal and unlabored. Afebrile. Vitals Time Taken: 2:31 PM, Temperature: 98.0 F, Pulse: 72 bpm, Respiratory Rate: 18 breaths/min, Blood Pressure: 118/65 mmHg. Roblero, [place] ([id number]) Eyes Nonicteric. Reactive to light. Ears, Nose, Mouth, and Throat Lips, teeth, and gums WNL.Marland Kitchen Moist mucosa without lesions. Neck supple and nontender. No palpable supraclavicular or cervical adenopathy. Normal sized without goiter. Respiratory WNL. No retractions.. Cardiovascular Pedal Pulses WNL. left ABI could not be measured but the right ABI was 1.1. large hematoma with severe MR left lower extremity. Chest Breasts symmetical and no nipple discharge.. Breast tissue WNL, no masses, lumps, or tenderness.. Gastrointestinal (GI) Abdomen without masses or tenderness.. No liver or spleen enlargement or tenderness.. Lymphatic No adneopathy. No adenopathy. No adenopathy. Musculoskeletal Adexa without tenderness or enlargement.. Digits and nails w/o clubbing, cyanosis, infection, petechiae, ischemia, or inflammatory conditions.Marland Kitchen Psychiatric Judgement and insight Intact.. No evidence of depression, anxiety, or agitation.. General Notes: the patient has a dry  eschar on the left lateral compartment and this is the area which is surrounded by a large seroma and hematoma with ecchymosis. No cellulitis. Integumentary (Hair, Skin) No suspicious lesions. No crepitus or fluctuance. No peri-wound warmth or erythema. No masses.. Wound #1 status is Open. Original cause of wound was Trauma. The wound is located on the Left,Lateral Lower Leg. The wound measures 2.7cm length x 1.8cm width x 0.1cm depth;  3.817cm^2 area and 0.382cm^3 volume. The wound is limited to skin breakdown. There is no tunneling or undermining noted. There is a none present amount of drainage noted. The wound margin is distinct with the outline attached to the wound base. There is no granulation within the wound bed. There is a large (67-100%) amount of necrotic tissue within the wound bed including Eschar. The periwound skin appearance exhibited: Dry/Scaly. The periwound skin appearance did not exhibit: Callus, Crepitus, Excoriation, Fluctuance, Friable, Induration, Localized Edema, Rash, Scarring, Maceration, Moist, Atrophie Blanche, Cyanosis, Ecchymosis, Hemosiderin Staining, Mottled, Pallor, Rubor, Erythema. Periwound temperature was noted as No Abnormality. Minihan, [name] 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 At this stage he has got a dry eschar which is covering possibly a full-thickness necrosis of the skin and subcutis tissue. I have recommended a ultrasound and aspiration of this left lower extremity and have spoken to the ultrasound tech who agreed to get this scheduled for the patient. Once the aspiration is done we will open out the subcutaneous tissue and continue to treat appropriately possibly with the wound VAC placement if required. The daughter who is the caregiver and is at the bedside understands the treatment plan Plan Secondary Dressing: Wound #1 Left,Lateral Lower Leg: Gauze and Kerlix/Conform Follow-up Appointments: Wound #1 Left,Lateral Lower Leg: Return Appointment in 1 week. ordered were: Ultrasound - For evaluation and possible  drainage/aspiration This 82 year old patient has had a huge hematoma for left lower extremity as a result of her injury with a blunt object about a month ago. Luber, [name] ([id number]) At this stage he has got a dry eschar which is covering possibly a full-thickness necrosis of the skin and subcutis tissue. I have recommended a ultrasound and aspiration of this left lower extremity and have spoken to the ultrasound tech who agreed to get this scheduled for the patient. Once the aspiration is done we will open out the subcutaneous tissue and continue to treat appropriately possibly with the wound VAC placement if required. The daughter who is the caregiver and is at the bedside understands the treatment plan Electronic Signature(s) Signed: 10/03/2015 3:22:00 PM By: [name] Fudge MD, FACS Entered By: [name] on 10/03/2015 21:30:86 Gaumond, [name] ([id number]) -------------------------------------------------------------------------------- ROS/PFSH Details Patient Name: [name] Date of Service: 10/03/2015 2:30 PM Medical Record Patient Account Number: [id number] Number: [name], Treating [name]: 22-Aug-1925 (82 y.o. [name] Date of Birth/Sex: Female) Other Clinician: Primary Care Physician: [name] Treating [name] Referring Physician: [place] [name] Physician/Extender: [name] in Treatment: 0 Information Obtained From Patient Wound History Do you currently have one or more open woundso Yes How many open wounds do you currently haveo 1 Approximately how long have you had your woundso 4 weeks How have you been  treating your wound(s) until nowo open to air Has your wound(s) ever healed and then re-openedo No Have you had any lab work done in the past montho No Have you tested positive for an antibiotic resistant organism (MRSA, VRE)o No Eyes Complaints and Symptoms: Positive for: Glasses / Contacts Medical History: Positive for: Cataracts -  cortical, senile Ear/Nose/Mouth/Throat Complaints and Symptoms: Positive for: Difficult clearing ears - Hearing aids Medical History: Negative for: Chronic sinus problems/congestion; Middle ear problems Cardiovascular Complaints and Symptoms: Positive for: LE edema Medical History: Positive for: Coronary Artery Disease; Hypertension Past Medical History Notes: stroke Integumentary (Skin) Complaints and Symptoms: Positive for: Wounds [name], [name] ([id number]) Negative for: Bleeding or bruising tendency; Breakdown; Swelling Medical History: Negative for: History of Burn; History of pressure wounds Musculoskeletal Complaints and Symptoms: Positive for: Muscle Weakness Medical History: Positive for: Osteoarthritis Hematologic/Lymphatic Complaints and Symptoms: No Complaints or Symptoms Medical History: Positive for: Anemia Negative for: Hemophilia; Human Immunodeficiency Virus; Lymphedema; Sickle Cell Disease Respiratory Complaints and Symptoms: No Complaints or Symptoms Medical History: Negative for: Aspiration; Chronic Obstructive Pulmonary Disease (COPD); Pneumothorax; Sleep Apnea; Tuberculosis Gastrointestinal Complaints and Symptoms: No Complaints or Symptoms Medical History: Negative for: Cirrhosis ; Colitis; Crohnos; Hepatitis A; Hepatitis B; Hepatitis C Past Medical History Notes: colonic polyps Endocrine Complaints and Symptoms: No Complaints or Symptoms Medical History: Negative for: Type I Diabetes; Type II Diabetes Genitourinary Hata, [name] ([id number]) Complaints and Symptoms: No Complaints or Symptoms Medical History: Negative for: End Stage Renal Disease Immunological Complaints and Symptoms: No Complaints or Symptoms Medical History: Negative for: Lupus Erythematosus; Raynaudos; Scleroderma Neurologic Complaints and Symptoms: No Complaints or Symptoms Medical History: Negative for: Dementia; Neuropathy; Quadriplegia; Paraplegia;  Seizure Disorder Oncologic Complaints and Symptoms: No Complaints or Symptoms Medical History: Past Medical History Notes: throat cancer in 2005 Psychiatric Complaints and Symptoms: No Complaints or Symptoms Medical History: Negative for: Anorexia/bulimia; Confinement Anxiety HBO Extended History Items Eyes: Cataracts Family and Social History Cancer: Yes - Siblings; Diabetes: No; Heart Disease: Yes - Child; Hereditary Spherocytosis: No; Hypertension: Yes - Child; Kidney Disease: Yes - Child; Lung Disease: No; Seizures: Yes - Siblings, Child; Stroke: Yes - Child; Thyroid Problems: No; Tuberculosis: No; Never smoker - "snuff"; Marital Status - Widowed; Alcohol Use: Moderate; Drug Use: No History; Caffeine Use: Daily - rea; Financial Concerns: No; Food, Clothing or Shelter Needs: No; Support System Lacking: No; Transportation Concerns: No; Advanced Directives: No; Patient does not want information on Advanced Directives; Living Will: No Kosloski, [name] ([id number]) Physician Affirmation I have reviewed and agree with the above information. Electronic Signature(s) Signed: 10/03/2015 3:35:32 PM By: [name] Fudge MD, FACS Signed: 10/03/2015 5:00:07 PM By: [name] Previous Signature: 10/03/2015 2:29:02 PM Version By: [name] Fudge MD, FACS Entered By: [name] Lemming on 10/03/2015 16:07:37 Crumpacker, [name] ([id number]) -------------------------------------------------------------------------------- SuperBill Details Patient Name: Achor, [name] Date of Service: 10/03/2015 Medical Record Patient Account Number: [id number] Number: [name], Treating [name]: 08-23-1925 (82 y.o. [name] Date of Birth/Sex: Female) Other Clinician: Primary Care Physician: [name] Treating [name] Referring Physician: [place] [name] Physician/Extender: [name] in Treatment: 0 Diagnosis Coding ICD-10 Codes Code Description M79.81 Nontraumatic hematoma of soft  tissue L97.222 Non-pressure chronic ulcer of left calf with fat layer exposed Y29.Merril Abbe Contact with blunt object, undetermined intent, initial encounter Physician Procedures CPT4 Code Description: [id number] - WC PHYS LEVEL 4 - NEW PT ICD-10 Description Diagnosis M79.81 Nontraumatic hematoma of soft tissue L97.222 Non-pressure chronic ulcer of left calf with fat  lay Y29.XXXA Contact with blunt object, undetermined intent,  init Modifier: er exposed ial encounter Quantity: 1 Electronic Signature(s) Signed: 10/03/2015 3:22:13 PM By: [name] Fudge MD, FACS Entered By: [name] on 10/03/2015 15:22:12

## 2015-10-04 NOTE — Progress Notes (Addendum)
[name], [name] ([id number]) Visit Report for 10/03/2015 Allergy List Details Patient Name: [name], [name]. Date of Service: 10/03/2015 2:30 PM Medical Record Number: [id number] Patient Account Number: [id number] Date of Birth/Sex: June 04, 1925 (82 y.o. Female) Treating RN: Afful, RN, BSN, [name] Primary Care Physician: [name] Other Clinician: Referring Physician: [name] Treating Physician/Extender: Frann Rider in Treatment: 0 Allergies Active Allergies Lipitor Allergy Notes Electronic Signature(s) Signed: 10/03/2015 5:00:07 PM By: Regan Lemming BSN, RN Entered By: Regan Lemming on 10/03/2015 78:29:56 [place], [name] -------------------------------------------------------------------------------- Arrival Information Details Patient Name: [name] Date of Service: 10/03/2015 2:30 PM Medical Record Number: [id number] Patient Account Number: [id number] Date of Birth/Sex: [birth year] (82 y.o. Female) Treating RN: Baruch Gouty, RN, BSN, [name] Primary Care Physician: [name] Other Clinician: Referring Physician: [name] Treating Physician/Extender: Frann Rider in Treatment: 0 Visit Information Patient Arrived: Wheel Chair Arrival Time: 14:26 Accompanied By: dtr Transfer Assistance: Manual Patient Identification Verified: Yes Secondary Verification Process Yes Completed: Patient Requires Transmission-Based No Precautions: Patient Has Alerts: No Electronic Signature(s) Signed: 10/03/2015 5:00:07 PM By: Regan Lemming BSN, RN Entered By: Regan Lemming on 10/03/2015 41:32:44 [place], [name] ([id number]) -------------------------------------------------------------------------------- Clinic Level of Care Assessment Details Patient Name: [name], [name]. Date of Service: 10/03/2015 2:30 PM Medical Record Number: [id number] Patient Account Number: [id number] Date of Birth/Sex: 1924-09-20 (82 y.o. Female) Treating RN: Afful, RN, BSN,  [name] Primary Care Physician: [name] Other Clinician: Referring Physician: [name] Treating Physician/Extender: Frann Rider in Treatment: 0 Clinic Level of Care Assessment Items TOOL 2 Quantity Score '[]'$  - Use when only an EandM is performed on the INITIAL visit 0 ASSESSMENTS - Nursing Assessment / Reassessment X - General Physical Exam (combine w/ comprehensive assessment (listed just 1 20 below) when performed on new pt. evals) X - Comprehensive Assessment (HX, ROS, Risk Assessments, Wounds Hx, etc.) 1 25 ASSESSMENTS - Wound and Skin Assessment / Reassessment X - Simple Wound Assessment / Reassessment - one wound 1 5 '[]'$  - Complex Wound Assessment / Reassessment - multiple wounds 0 '[]'$  - Dermatologic / Skin Assessment (not related to wound area) 0 ASSESSMENTS - Ostomy and/or Continence Assessment and Care '[]'$  - Incontinence Assessment and Management 0 '[]'$  - Ostomy Care Assessment and Management (repouching, etc.) 0 PROCESS - Coordination of Care X - Simple Patient / Family Education for ongoing care 1 15 '[]'$  - Complex (extensive) Patient / Family Education for ongoing care 0 '[]'$  - Staff obtains Programmer, systems, Records, Test Results / Process Orders 0 '[]'$  - Staff telephones HHA, Nursing Homes / Clarify orders / etc 0 '[]'$  - Routine Transfer to another Facility (non-emergent condition) 0 '[]'$  - Routine Hospital Admission (non-emergent condition) 0 X - New Admissions / Biomedical engineer / Ordering NPWT, Apligraf, etc. 1 15 '[]'$  - Emergency Hospital Admission (emergent condition) 0 '[]'$  - Simple Discharge Coordination 0 [name], [name] '[]'$  - Complex (extensive) Discharge Coordination 0 PROCESS - Special Needs '[]'$  - Pediatric / Minor Patient Management 0 '[]'$  - Isolation Patient Management 0 '[]'$  - Hearing / Language / Visual special needs 0 '[]'$  - Assessment of Community assistance (transportation, D/C planning, etc.) 0 '[]'$  - Additional assistance / Altered mentation  0 '[]'$  - Support Surface(s) Assessment (bed, cushion, seat, etc.) 0 INTERVENTIONS - Wound Cleansing / Measurement X - Wound Imaging (photographs - any number of wounds) 1 5 '[]'$  - Wound Tracing (instead of photographs) 0 X - Simple Wound Measurement - one wound 1 5 '[]'$  - Complex Wound Measurement -  multiple wounds 0 '[]'$  - Simple Wound Cleansing - one wound 0 '[]'$  - Complex Wound Cleansing - multiple wounds 0 INTERVENTIONS - Wound Dressings X - Small Wound Dressing one or multiple wounds 1 10 '[]'$  - Medium Wound Dressing one or multiple wounds 0 '[]'$  - Large Wound Dressing one or multiple wounds 0 '[]'$  - Application of Medications - injection 0 INTERVENTIONS - Miscellaneous '[]'$  - External ear exam 0 '[]'$  - Specimen Collection (cultures, biopsies, blood, body fluids, etc.) 0 '[]'$  - Specimen(s) / Culture(s) sent or taken to Lab for analysis 0 '[]'$  - Patient Transfer (multiple staff / Harrel Lemon Lift / Similar devices) 0 '[]'$  - Simple Staple / Suture removal (25 or less) 0 '[]'$  - Complex Staple / Suture removal (26 or more) 0 [name], [name] ([id number]) '[]'$  - Hypo / Hyperglycemic Management (close monitor of Blood Glucose) 0 '[]'$  - Ankle / Brachial Index (ABI) - do not check if billed separately 0 Has the patient been seen at the hospital within the last three years: Yes Total Score: 100 Level Of Care: New/Established - Level 3 Electronic Signature(s) Signed: 10/05/2015 4:27:43 PM By: Regan Lemming BSN, RN Entered By: Regan Lemming on 10/05/2015 13:24:40 [name], [name] ([id number]) -------------------------------------------------------------------------------- Encounter Discharge Information Details Patient Name: [name], [name]. Date of Service: 10/03/2015 2:30 PM Medical Record Number: [id number] Patient Account Number: [id number] Date of Birth/Sex: 05/26/1925 (82 y.o. Female) Treating RN: Baruch Gouty, RN, BSN, [name] Primary Care Physician: [name] Other Clinician: Referring Physician: [name] Treating  Physician/Extender: Frann Rider in Treatment: 0 Encounter Discharge Information Items Discharge Pain Level: 0 Discharge Condition: Stable Ambulatory Status: Wheelchair Discharge Destination: Home Private Transportation: Auto Accompanied By: dtr Schedule Follow-up Appointment: No Medication Reconciliation completed and No provided to Patient/Care Jarika Robben: Clinical Summary of Care: Electronic Signature(s) Signed: 10/03/2015 5:00:07 PM By: Regan Lemming BSN, RN Entered By: Regan Lemming on 10/03/2015 95:63:87 [place], [name] ([id number]) -------------------------------------------------------------------------------- Lower Extremity Assessment Details Patient Name: [name], [name]. Date of Service: 10/03/2015 2:30 PM Medical Record Number: [id number] Patient Account Number: [id number] Date of Birth/Sex: Jul 10, 1925 (82 y.o. Female) Treating RN: Afful, RN, BSN, [name] Primary Care Physician: [name] Other Clinician: Referring Physician: [name] Treating Physician/Extender: Frann Rider in Treatment: 0 Edema Assessment Assessed: Shirlyn Goltz: No] [Right: No] E[Left: dema] [Right: :] Calf Left: Right: Point of Measurement: 36 cm From Medial Instep 38.5 cm 35.1 cm Ankle Left: Right: Point of Measurement: 9 cm From Medial Instep 21.5 cm 20.2 cm Vascular Assessment Pulses: Posterior Tibial Palpable: [Left:No] [Right:No] Doppler: [Left:Multiphasic] [Right:Multiphasic] Dorsalis Pedis Palpable: [Left:No] Doppler: [Left:Multiphasic] [Right:Multiphasic] Extremity colors, hair growth, and conditions: Extremity Color: [Left:Dusky] [Right:Normal] Hair Growth on Extremity: [Left:No] [Right:No] Temperature of Extremity: [Left:Warm] [Right:Cool] Capillary Refill: [Left:< 3 seconds] [Right:< 3 seconds] Blood Pressure: Brachial: [Right:116] Dorsalis Pedis: [Left:Dorsalis Pedis: 128] Ankle: Posterior Tibial: [Left:Posterior Tibial:] [Right:1.10] Toe Nail  Assessment Left: Right: Thick: Yes Discolored: Yes Yes Deformed: No No Improper Length and Hygiene: Yes Yes Osgood, Ocia Clayton Bibles ([id number]) Electronic Signature(s) Signed: 10/03/2015 5:00:07 PM By: Regan Lemming BSN, RN Entered By: Regan Lemming on 10/03/2015 35:57:32 Keir, [name] ([id number]) -------------------------------------------------------------------------------- Multi Wound Chart Details Patient Name: [name], [name]. Date of Service: 10/03/2015 2:30 PM Medical Record Number: [id number] Patient Account Number: [id number] Date of Birth/Sex: July 14, 1925 (82 y.o. Female) Treating RN: Baruch Gouty, RN, BSN, [name] Primary Care Physician: [name] Other Clinician: Referring Physician: [name] Treating Physician/Extender: Frann Rider in Treatment: 0 Vital Signs Height(in): Pulse(bpm): 72 Weight(lbs): Blood Pressure 118/65 (mmHg): Body  Mass Index(BMI): Temperature(F): 98.0 Respiratory Rate 18 (breaths/min): Photos: [1:No Photos] [N/A:N/A] Wound Location: [1:Left Lower Leg - Lateral] [N/A:N/A] Wounding Event: [1:Trauma] [N/A:N/A] Primary Etiology: [1:Trauma, Other] [N/A:N/A] Comorbid History: [1:Cataracts, Anemia, Coronary Artery Disease, Hypertension, Osteoarthritis] [N/A:N/A] Date Acquired: [1:09/08/2015] [N/A:N/A] Weeks of Treatment: [1:0] [N/A:N/A] Wound Status: [1:Open] [N/A:N/A] Measurements L x W x D 2.7x1.8x0.1 [N/A:N/A] (cm) Area (cm) : [1:3.817] [N/A:N/A] Volume (cm) : [1:0.382] [N/A:N/A] % Reduction in Area: [1:0.00%] [N/A:N/A] % Reduction in Volume: 0.00% [N/A:N/A] Classification: [1:Unclassifiable] [N/A:N/A] Exudate Amount: [1:None Present] [N/A:N/A] Wound Margin: [1:Distinct, outline attached] [N/A:N/A] Granulation Amount: [1:None Present (0%)] [N/A:N/A] Necrotic Amount: [1:Large (67-100%)] [N/A:N/A] Necrotic Tissue: [1:Eschar] [N/A:N/A] Exposed Structures: [1:Fascia: No Fat: No Tendon: No Muscle: No Joint: No Bone: No]  [N/A:N/A] Limited to Skin Breakdown Epithelialization: None N/A N/A Periwound Skin Texture: Edema: No N/A N/A Excoriation: No Induration: No Callus: No Crepitus: No Fluctuance: No Friable: No Rash: No Scarring: No Periwound Skin Dry/Scaly: Yes N/A N/A Moisture: Maceration: No Moist: No Periwound Skin Color: Atrophie Blanche: No N/A N/A Cyanosis: No Ecchymosis: No Erythema: No Hemosiderin Staining: No Mottled: No Pallor: No Rubor: No Temperature: No Abnormality N/A N/A Tenderness on No N/A N/A Palpation: Wound Preparation: Ulcer Cleansing: N/A N/A Rinsed/Irrigated with Saline Topical Anesthetic Applied: Other: lidocaine 4% Treatment Notes Electronic Signature(s) Signed: 10/03/2015 5:00:07 PM By: Regan Lemming BSN, RN Entered By: Regan Lemming on 10/03/2015 57:84:69 Lill, [name] ([id number]) -------------------------------------------------------------------------------- Multi-Disciplinary Care Plan Details Patient Name: [name], [name]. Date of Service: 10/03/2015 2:30 PM Medical Record Number: [id number] Patient Account Number: [id number] Date of Birth/Sex: June 21, 1925 (82 y.o. Female) Treating RN: Afful, RN, BSN, [name] Primary Care Physician: [name] Other Clinician: Referring Physician: [name] Treating Physician/Extender: Frann Rider in Treatment: 0 Active Inactive Abuse / Safety / Falls / Self Care Management Nursing Diagnoses: Impaired home maintenance Impaired physical mobility Potential for falls Self care deficit: actual or potential Goals: Patient will remain injury free Date Initiated: 10/03/2015 Goal Status: Active Patient/caregiver will verbalize understanding of skin care regimen Date Initiated: 10/03/2015 Goal Status: Active Patient/caregiver will verbalize/demonstrate measure taken to improve self care Date Initiated: 10/03/2015 Goal Status: Active Patient/caregiver will verbalize/demonstrate measures taken to improve  the patient's personal safety Date Initiated: 10/03/2015 Goal Status: Active Patient/caregiver will verbalize/demonstrate measures taken to prevent injury and/or falls Date Initiated: 10/03/2015 Goal Status: Active Patient/caregiver will verbalize/demonstrate understanding of what to do in case of emergency Date Initiated: 10/03/2015 Goal Status: Active Interventions: Assess fall risk on admission and as needed Assess: immobility, friction, shearing, incontinence upon admission and as needed Assess impairment of mobility on admission and as needed per policy Assess self care needs on admission and as needed Patient referred to community resources (specify in notes) Provide education on basic hygiene [name], LAJUAN GODBEE ([id number]) Provide education on fall prevention Provide education on personal and home safety Provide education on safe transfers Notes: Orientation to the Wound Care Program Nursing Diagnoses: Knowledge deficit related to the wound healing center program Goals: Patient/caregiver will verbalize understanding of the [place] Date Initiated: 10/03/2015 Goal Status: Active Interventions: Provide education on orientation to the wound center Notes: Wound/Skin Impairment Nursing Diagnoses: Impaired tissue integrity Knowledge deficit related to smoking impact on wound healing Knowledge deficit related to ulceration/compromised skin integrity Goals: Patient/caregiver will verbalize understanding of skin care regimen Date Initiated: 10/03/2015 Goal Status: Active Ulcer/skin breakdown will have a volume reduction of 30% by week 4 Date Initiated: 10/03/2015 Goal Status: Active Ulcer/skin breakdown will have a volume  reduction of 50% by week 8 Date Initiated: 10/03/2015 Goal Status: Active Ulcer/skin breakdown will have a volume reduction of 80% by week 12 Date Initiated: 10/03/2015 Goal Status: Active Ulcer/skin breakdown will heal within 14 weeks Date  Initiated: 10/03/2015 Goal Status: Active Interventions: Biondo, Aryanne VMarland Kitchen ([id number]) Assess patient/caregiver ability to obtain necessary supplies Assess patient/caregiver ability to perform ulcer/skin care regimen upon admission and as needed Assess ulceration(s) every visit Provide education on ulcer and skin care Notes: Electronic Signature(s) Signed: 10/03/2015 5:00:07 PM By: Regan Lemming BSN, RN Entered By: Regan Lemming on 10/03/2015 00:86:76 [place], [name] ([id number]) -------------------------------------------------------------------------------- Pain Assessment Details Patient Name: Kelm, [name]. Date of Service: 10/03/2015 2:30 PM Medical Record Number: [id number] Patient Account Number: [id number] Date of Birth/Sex: [birth year] (82 y.o. Female) Treating RN: Baruch Gouty, RN, BSN, [name] Primary Care Physician: [name] Other Clinician: Referring Physician: [name] Treating Physician/Extender: Frann Rider in Treatment: 0 Active Problems Location of Pain Severity and Description of Pain Patient Has Paino No Site Locations Pain Management and Medication Current Pain Management: Electronic Signature(s) Signed: 10/03/2015 5:00:07 PM By: Regan Lemming BSN, RN Entered By: Regan Lemming on 10/03/2015 33:82:50 [place], [name] ([id number]) -------------------------------------------------------------------------------- Patient/Caregiver Education Details Patient Name: [name] Date of Service: 10/03/2015 2:30 PM Medical Record Number: [id number] Patient Account Number: [id number] Date of Birth/Gender: [birth year] (82 y.o. Female) Treating RN: Baruch Gouty, RN, BSN, [name] Primary Care Physician: [name] Other Clinician: Referring Physician: [name] Treating Physician/Extender: Frann Rider in Treatment: 0 Education Assessment Education Provided To: Patient Education Topics Provided Basic Hygiene: Methods: Explain/Verbal Responses: State  content correctly Safety: Methods: Explain/Verbal Responses: State content correctly Welcome To [facility]: Methods: Explain/Verbal Responses: State content correctly Wound/Skin Impairment: Methods: Explain/Verbal Responses: State content correctly Electronic Signature(s) Signed: 10/03/2015 5:00:07 PM By: Regan Lemming BSN, RN Entered By: Regan Lemming on 10/03/2015 73:53:29 Trim, [name] ([id number]) -------------------------------------------------------------------------------- Wound Assessment Details Patient Name: Presswood, Lorynn V. Date of Service: 10/03/2015 2:30 PM Medical Record Number: [id number] Patient Account Number: [id number] Date of Birth/Sex: Aug 27, 1925 (82 y.o. Female) Treating RN: Afful, RN, BSN, [place] Primary Care Physician: [name] Other Clinician: Referring Physician: [name] Treating Physician/Extender: Frann Rider in Treatment: 0 Wound Status Wound Number: 1 Primary Trauma, Other Etiology: Wound Location: Left Lower Leg - Lateral Wound Open Wounding Event: Trauma Status: Date Acquired: 09/08/2015 Comorbid Cataracts, Anemia, Coronary Artery Weeks Of Treatment: 0 History: Disease, Hypertension, Osteoarthritis Clustered Wound: No Wound Measurements Length: (cm) 2.7 Width: (cm) 1.8 Depth: (cm) 0.1 Area: (cm) 3.817 Volume: (cm) 0.382 % Reduction in Area: 0% % Reduction in Volume: 0% Epithelialization: None Tunneling: No Undermining: No Wound Description Classification: Unclassifiable Wound Margin: Distinct, outline attached Exudate Amount: None Present Foul Odor After Cleansing: No Wound Bed Granulation Amount: None Present (0%) Exposed Structure Necrotic Amount: Large (67-100%) Fascia Exposed: No Necrotic Quality: Eschar Fat Layer Exposed: No Tendon Exposed: No Muscle Exposed: No Joint Exposed: No Bone Exposed: No Limited to Skin Breakdown Periwound Skin Texture Texture Color No Abnormalities Noted:  No No Abnormalities Noted: No Callus: No Atrophie Blanche: No Crepitus: No Cyanosis: No Excoriation: No Ecchymosis: No Fluctuance: No Erythema: No Friable: No Hemosiderin Staining: No Induration: No Mottled: No Mizner, Aijalon V. ([id number]) Localized Edema: No Pallor: No Rash: No Rubor: No Scarring: No Temperature / Pain Moisture Temperature: No Abnormality No Abnormalities Noted: No Dry / Scaly: Yes Maceration: No Moist: No Wound Preparation Ulcer Cleansing: Rinsed/Irrigated with Saline Topical Anesthetic Applied: Other: lidocaine 4%, Electronic Signature(s)  Signed: 10/27/2015 9:42:46 AM By: Montey Hora Signed: 05/14/2016 3:29:10 PM By: Regan Lemming BSN, RN Previous Signature: 10/03/2015 5:00:07 PM Version By: Regan Lemming BSN, RN Entered By: Montey Hora on 10/27/2015 51:10:21 Proehl, [name] ([id number]) -------------------------------------------------------------------------------- Vitals Details Patient Name: Gillen, Marjon V. Date of Service: 10/03/2015 2:30 PM Medical Record Number: [id number] Patient Account Number: [id number] Date of Birth/Sex: 1925-04-09 (82 y.o. Female) Treating RN: Baruch Gouty, RN, BSN, [name] Primary Care Physician: [name] Other Clinician: Referring Physician: [name] Treating Physician/Extender: Frann Rider in Treatment: 0 Vital Signs Time Taken: 14:31 Temperature (F): 98.0 Pulse (bpm): 72 Respiratory Rate (breaths/min): 18 Blood Pressure (mmHg): 118/65 Reference Range: 80 - 120 mg / dl Electronic Signature(s) Signed: 10/03/2015 5:00:07 PM By: Regan Lemming BSN, RN Entered By: Regan Lemming on 10/03/2015 14:31:07

## 2015-10-05 ENCOUNTER — Ambulatory Visit
Admission: RE | Admit: 2015-10-05 | Discharge: 2015-10-05 | Disposition: A | Discharge status: Home / Self Care | Payer: Medicare Other | Source: Ambulatory Visit | Attending: Surgery | Admitting: Surgery

## 2015-10-05 DIAGNOSIS — X58XXXA Exposure to other specified factors, initial encounter: Secondary | ICD-10-CM | POA: Diagnosis not present

## 2015-10-05 DIAGNOSIS — S8012XA Contusion of left lower leg, initial encounter: Secondary | ICD-10-CM | POA: Insufficient documentation

## 2015-10-05 DIAGNOSIS — T148XXA Other injury of unspecified body region, initial encounter: Secondary | ICD-10-CM

## 2015-10-09 ENCOUNTER — Other Ambulatory Visit: Payer: Self-pay | Admitting: Surgery

## 2015-10-09 ENCOUNTER — Encounter: Payer: Medicare Other | Admitting: Surgery

## 2015-10-09 ENCOUNTER — Ambulatory Visit
Admission: RE | Admit: 2015-10-09 | Discharge: 2015-10-09 | Disposition: A | Discharge status: Home / Self Care | Payer: Medicare Other | Source: Ambulatory Visit | Attending: Surgery | Admitting: Surgery

## 2015-10-09 DIAGNOSIS — S8012XA Contusion of left lower leg, initial encounter: Secondary | ICD-10-CM | POA: Diagnosis present

## 2015-10-09 DIAGNOSIS — M7981 Nontraumatic hematoma of soft tissue: Secondary | ICD-10-CM | POA: Diagnosis not present

## 2015-10-09 DIAGNOSIS — T148XXA Other injury of unspecified body region, initial encounter: Secondary | ICD-10-CM

## 2015-10-09 DIAGNOSIS — X58XXXA Exposure to other specified factors, initial encounter: Secondary | ICD-10-CM | POA: Diagnosis not present

## 2015-10-09 NOTE — Progress Notes (Addendum)
[name], [name] ([id number]) Visit Report for 10/09/2015 Arrival Information Details Patient Name: [name], [name] [birth year]/23[birth year]8117 14:78 Date of Service: AM Medical Record [id number] Number: Patient Account Number: [id number] [birth year] (82 y.o. Treating RN: Macarthur Critchley Date of Birth/Sex: Female) Other Clinician: Primary Care Physician: [name] Treating Christin Fudge Referring Physician: [name] Physician/Extender: Suella Grove in Treatment: 0 Visit Information History Since Last Visit All ordered tests and consults were completed: No Patient Arrived: [name] Added or deleted any medications: No Arrival Time: 10:48 Any new allergies or adverse reactions: No Accompanied By: daughter Had a fall or experienced change in No Transfer Assistance: None activities of daily living that may affect Patient Identification Verified: Yes risk of falls: Secondary Verification Process Yes Signs or symptoms of abuse/neglect since last No Completed: visito Patient Requires Transmission-Based No Hospitalized since last visit: No Precautions: Has Dressing in Place as Prescribed: Yes Patient Has Alerts: No Pain Present Now: No Electronic Signature(s) Signed: 10/09/2015 3:45:43 PM By: Rebecca Eaton, RN, Sendra Entered By: Rebecca Eaton RN, Sendra on 10/09/2015 65:78:46 [name] ([id number]) -------------------------------------------------------------------------------- Clinic Level of Care Assessment Details Patient Name: [name] 12/07/4008 27:25 Date of Service: AM Medical Record [id number] Number: Patient Account Number: [id number] 1924-12-04 (82 y.o. Treating RN: Macarthur Critchley Date of Birth/Sex: Female) Other Clinician: Primary Care Physician: [name] Treating Christin Fudge Referring Physician: [name] Physician/Extender: Suella Grove in Treatment: 0 Clinic Level of Care Assessment Items TOOL 4 Quantity Score X - Use when only an EandM is performed on  FOLLOW-UP visit 1 0 ASSESSMENTS - Nursing Assessment / Reassessment X - Reassessment of Co-morbidities (includes updates in patient status) 1 10 X - Reassessment of Adherence to Treatment Plan 1 5 ASSESSMENTS - Wound and Skin Assessment / Reassessment X - Simple Wound Assessment / Reassessment - one wound 1 5 '[]'$  - Complex Wound Assessment / Reassessment - multiple wounds 0 '[]'$  - Dermatologic / Skin Assessment (not related to wound area) 0 ASSESSMENTS - Focused Assessment X - Circumferential Edema Measurements - multi extremities 1 5 '[]'$  - Nutritional Assessment / Counseling / Intervention 0 X - Lower Extremity Assessment (monofilament, tuning fork, pulses) 1 5 '[]'$  - Peripheral Arterial Disease Assessment (using hand held doppler) 0 ASSESSMENTS - Ostomy and/or Continence Assessment and Care '[]'$  - Incontinence Assessment and Management 0 '[]'$  - Ostomy Care Assessment and Management (repouching, etc.) 0 PROCESS - Coordination of Care X - Simple Patient / Family Education for ongoing care 1 15 '[]'$  - Complex (extensive) Patient / Family Education for ongoing care 0 X - Staff obtains Programmer, systems, Records, Test Results / Process Orders 1 10 '[]'$  - Staff telephones HHA, Nursing Homes / Clarify orders / etc 0 [name], [name] ([id number]) '[]'$  - Routine Transfer to another Facility (non-emergent condition) 0 '[]'$  - Routine Hospital Admission (non-emergent condition) 0 '[]'$  - New Admissions / Biomedical engineer / Ordering NPWT, Apligraf, etc. 0 '[]'$  - Emergency Hospital Admission (emergent condition) 0 X - Simple Discharge Coordination 1 10 '[]'$  - Complex (extensive) Discharge Coordination 0 PROCESS - Special Needs '[]'$  - Pediatric / Minor Patient Management 0 '[]'$  - Isolation Patient Management 0 '[]'$  - Hearing / Language / Visual special needs 0 '[]'$  - Assessment of Community assistance (transportation, D/C planning, etc.) 0 '[]'$  - Additional assistance / Altered mentation 0 '[]'$  - Support Surface(s) Assessment (bed,  cushion, seat, etc.) 0 INTERVENTIONS - Wound Cleansing / Measurement X - Simple Wound Cleansing - one wound 1 5 '[]'$  - Complex Wound Cleansing - multiple wounds 0 X -  Wound Imaging (photographs - any number of wounds) 1 5 '[]'$  - Wound Tracing (instead of photographs) 0 X - Simple Wound Measurement - one wound 1 5 '[]'$  - Complex Wound Measurement - multiple wounds 0 INTERVENTIONS - Wound Dressings X - Small Wound Dressing one or multiple wounds 1 10 '[]'$  - Medium Wound Dressing one or multiple wounds 0 '[]'$  - Large Wound Dressing one or multiple wounds 0 '[]'$  - Application of Medications - topical 0 '[]'$  - Application of Medications - injection 0 [name], [name] ([id number]) INTERVENTIONS - Miscellaneous '[]'$  - External ear exam 0 '[]'$  - Specimen Collection (cultures, biopsies, blood, body fluids, etc.) 0 '[]'$  - Specimen(s) / Culture(s) sent or taken to Lab for analysis 0 '[]'$  - Patient Transfer (multiple staff / Harrel Lemon Lift / Similar devices) 0 '[]'$  - Simple Staple / Suture removal (25 or less) 0 '[]'$  - Complex Staple / Suture removal (26 or more) 0 '[]'$  - Hypo / Hyperglycemic Management (close monitor of Blood Glucose) 0 '[]'$  - Ankle / Brachial Index (ABI) - do not check if billed separately 0 X - Vital Signs 1 5 Has the patient been seen at the hospital within the last three years: Yes Total Score: 95 Level Of Care: New/Established - Level 3 Electronic Signature(s) Signed: 10/09/2015 3:45:43 PM By: Rebecca Eaton, RN, Sendra Entered By: Rebecca Eaton RN, Sendra on 10/09/2015 57:84:69 [name] ([id number]) -------------------------------------------------------------------------------- Encounter Discharge Information Details Patient Name: [name] 2/44/0102 72:53 Date of Service: AM Medical Record [id number] Number: Patient Account Number: [id number] 1925-07-26 (82 y.o. Treating RN: Macarthur Critchley Date of Birth/Sex: Female) Other Clinician: Primary Care Physician: [name] Treating Christin Fudge Referring Physician: [name] Physician/Extender: Suella Grove in Treatment: 0 Encounter Discharge Information Items Schedule Follow-up Appointment: No Medication Reconciliation completed and provided to Patient/Care No Moira Umholtz: Provided on Clinical Summary of Care: 10/09/2015 Form Type Recipient Paper Patient [facility] Electronic Signature(s) Signed: 10/09/2015 11:19:33 AM By: Ruthine Dose Entered By: Ruthine Dose on 10/09/2015 25:95:63 Caroll, Lona [name] ([id number]) -------------------------------------------------------------------------------- Lower Extremity Assessment Details Patient Name: [name] 02/01/[birth year]15 60:63 Date of Service: AM Medical Record [id number] Number: Patient Account Number: [id number] [birth year] (82 y.o. Treating RN: Macarthur Critchley Date of Birth/Sex: Female) Other Clinician: Primary Care Physician: [name] Treating Christin Fudge Referring Physician: [name] Physician/Extender: Suella Grove in Treatment: 0 Edema Assessment Assessed: [Left: No] [Right: No] Edema: [Left: Ye] [Right: s] Calf Left: Right: Point of Measurement: 32 cm From Medial Instep 35.5 cm cm Ankle Left: Right: Point of Measurement: 10 cm From Medial Instep 21.5 cm cm Vascular Assessment Pulses: Posterior Tibial Dorsalis Pedis Palpable: [Left:Yes] Extremity colors, hair growth, and conditions: Hair Growth on Extremity: [Left:No] Temperature of Extremity: [Left:Warm] Capillary Refill: [Left:< 3 seconds] Dependent Rubor: [Left:No] Blanched when Elevated: [Left:No] Lipodermatosclerosis: [Left:No] Toe Nail Assessment Left: Right: Thick: No Discolored: No Deformed: No Improper Length and Hygiene: No Electronic Signature(s) Signed: 10/09/2015 3:45:43 PM By: Rebecca Eaton, RN, Sendra Guagliardo, Alissia VMarland Kitchen ([id number]) Entered By: Rebecca Eaton RN, Sendra on 10/09/2015 54:27:06 Haubner, Lona [name]  ([id number]) -------------------------------------------------------------------------------- Pain Assessment Details Patient Name: [name] 1/76/1607 37:10 Date of Service: AM Medical Record [id number] Number: Patient Account Number: [id number] 12/27/1924 (82 y.o. Treating RN: Macarthur Critchley Date of Birth/Sex: Female) Other Clinician: Primary Care Physician: [name] Treating Christin Fudge Referring Physician: [name] Physician/Extender: Suella Grove in Treatment: 0 Active Problems Location of Pain Severity and Description of Pain Patient Has Paino No Site Locations Rate the pain. Current Pain Level: 0 Pain Management and Medication Current  Pain Management: Electronic Signature(s) Signed: 10/09/2015 3:45:43 PM By: Rebecca Eaton RN, Sendra Entered By: Rebecca Eaton RN, Sendra on 10/09/2015 99:37:16 Devan, Lona [name] ([id number]) -------------------------------------------------------------------------------- Patient/Caregiver Education Details Patient Name: MALEIYA, PERGOLA 1/75/1025 85:27 Date of Service: AM Medical Record [id number] Number: Patient Account Number: [id number] 10/10/1924 (82 y.o. Treating RN: Macarthur Critchley Date of Birth/Gender: Female) Other Clinician: Primary Care Physician: [name] Treating Christin Fudge Referring Physician: [name] Physician/Extender: Suella Grove in Treatment: 0 Education Assessment Education Provided To: Patient and Caregiver daughter Education Topics Provided Wound/Skin Impairment: Handouts: Caring for Your Ulcer, Skin Care Do's and Dont's, Other: hematoma Methods: Explain/Verbal Responses: State content correctly Electronic Signature(s) Signed: 10/09/2015 3:45:43 PM By: Rebecca Eaton, RN, Sendra Entered By: Rebecca Eaton RN, Sendra on 10/09/2015 14:43:15 Wheatley, Lona [name] ([id number]) -------------------------------------------------------------------------------- Wound Assessment Details Patient Name: [name] 01/22/3266 12:45 Date of Service: AM Medical Record [id number] Number: Patient Account Number: [id number] [birth year] (82 y.o. Treating RN: Macarthur Critchley Date of Birth/Sex: Female) Other Clinician: Primary Care Physician: [name] Treating Christin Fudge Referring Physician: [name] Physician/Extender: Suella Grove in Treatment: 0 Wound Status Wound Number: 1 Primary Trauma, Other Etiology: Wound Location: Left Lower Leg - Lateral Wound Open Wounding Event: Trauma Status: Date Acquired: 09/08/2015 Comorbid Cataracts, Anemia, Coronary Artery Weeks Of Treatment: 0 History: Disease, Hypertension, Osteoarthritis Clustered Wound: No Photos Wound Measurements Length: (cm) 2.3 % Reducti Width: (cm) 1.7 % Reducti Depth: (cm) 0 Epithelia Area: (cm) 3.071 Volume: (cm) 0.307 Zero values entered for Length, Width or Depth are replaced with 0.1 to facilitate the calculation of Area and Volume. on in Area: 19.5% on in Volume: 19.6% lization: None Wound Description Classification: Unclassifiable Wound Margin: Distinct, outline attached Exudate Amount: None Present Foul Odor After Cleansing: No Wound Bed Granulation Amount: None Present (0%) Exposed Structure Necrotic Amount: Large (67-100%) Fascia Exposed: No Necrotic Quality: Eschar Fat Layer Exposed: No Pribyl, Cordella V. ([id number]) Tendon Exposed: No Muscle Exposed: No Joint Exposed: No Bone Exposed: No Limited to Skin Breakdown Periwound Skin Texture Texture Color No Abnormalities Noted: No No Abnormalities Noted: No Callus: No Atrophie Blanche: No Crepitus: No Cyanosis: No Excoriation: No Ecchymosis: No Fluctuance: No Erythema: No Friable: No Hemosiderin Staining: No Induration: No Mottled: No Localized Edema: No Pallor: No Rash: No Rubor: No Scarring: No Temperature / Pain Moisture Temperature: No Abnormality No Abnormalities Noted: No Dry / Scaly: Yes Maceration: No Moist:  No Wound Preparation Ulcer Cleansing: Rinsed/Irrigated with Saline Treatment Notes Wound #1 (Left, Lateral Lower Leg) 1. Cleansed with: Clean wound with Normal Saline 4. Dressing Applied: Dry Gauze 5. Secondary Dressing Applied Kerlix/Conform Notes will continue to reach out to ultrasound dept for aspiration appointment. Family education provided that if area breaks open they are to report to ER. Call clinic after aspiration. Electronic Signature(s) Signed: 10/12/2015 4:29:37 PM By: Rebecca Eaton RN, Sendra Previous Signature: 10/09/2015 3:45:43 PM Version By: Rebecca Eaton RN, Sendra Entered By: Rebecca Eaton RN, Sendra on 10/12/2015 41:93:79 Espinoza, Lona [name] ([id number]) -------------------------------------------------------------------------------- Vitals Details Patient Name: [name] 2/99/2426 83:41 Date of Service: AM Medical Record [id number] Number: Patient Account Number: [id number] [birth year] (82 y.o. Treating RN: Macarthur Critchley Date of Birth/Sex: Female) Other Clinician: Primary Care Physician: [name] Treating Christin Fudge Referring Physician: [name] Physician/Extender: Suella Grove in Treatment: 0 Vital Signs Time Taken: 10:52 Temperature (F): 98.0 Pulse (bpm): 72 Blood Pressure (mmHg): 110/78 Reference Range: 80 - 120 mg / dl Electronic Signature(s) Signed: 10/09/2015 3:45:43 PM By: Rebecca Eaton RN, Sendra Entered By: Rebecca Eaton RN, Sendra on 10/09/2015 10:52:56

## 2015-10-09 NOTE — Progress Notes (Addendum)
[name], [name] ([id number]) Visit Report for 10/09/2015 Chief Complaint Document Details Patient Name: [name], [name] [birth year] 55:73 Date of Service: AM Medical Record [id number] Number: Patient Account Number: [id number] 08-09-1925 (82 y.o. Treating RN: Macarthur Critchley Date of Birth/Sex: Female) Other Clinician: Primary Care Physician: [name] Treating Christin Fudge Referring Physician: [name] Physician/Extender: Suella Grove in Treatment: 0 Information Obtained from: Patient Chief Complaint Patient presents to the wound care center for a consult due non healing wound to the left lower extremity for about a month Electronic Signature(s) Signed: 10/09/2015 11:35:46 AM By: Christin Fudge MD, FACS Entered By: Christin Fudge on 10/09/2015 62:37:62 [name], [name] ([id number]) -------------------------------------------------------------------------------- HPI Details Patient Name: [name] 0/73/7106 26:94 Date of Service: AM Medical Record [id number] Number: Patient Account Number: [id number] 1925/05/25 (82 y.o. Treating RN: Macarthur Critchley Date of Birth/Sex: Female) Other Clinician: Primary Care Physician: [name] Treating Christin Fudge Referring Physician: [name] Physician/Extender: Suella Grove in Treatment: 0 History of Present Illness Location: large swelling and wound of the left lower extremity Quality: Patient reports experiencing a dull pain to affected area(s). Severity: Patient states wound are getting worse. Duration: Patient has had the wound for < 4 weeks prior to presenting for treatment Timing: Pain in wound is constant (hurts all the time) Context: The wound occurred when the patient had a large heavy blunt object fall on her left leg Modifying Factors: Other treatment(s) tried include: has been on antibiotic ointment locally and orally Associated Signs and Symptoms: Patient reports having increase swelling. HPI Description:  82 year old patient who is known to have a nonhealing wound on the left lower leg has had this for about a month. She has been seen by her PCP and was on doxycycline earlier and now is on Augmentin. She is swelling of the leg and scab over this and the family requested a referral to the wound center. Her past medical history significant for arthritis, anemia, cardiovascular disease, deafness, hypertension, colon polyps and throat cancer. 10/09/2015 -- Ultrasound of the left lower extremity done which showed IMPRESSION: Complex collection within the lateral left lower calf measuring at least 3 x 2.8 x 7 cm. This may represent an evolving hematoma, but correlate clinically as infection/ abscess is not excluded. Radiology had spoken to me about this patient after the ultrasound and I had requested a radiologist to see her for a aspiration of the hematoma under ultrasound guidance. this appointment is still pending and we are trying to facilitate this for the patient and her daughter Electronic Signature(s) Signed: 10/09/2015 11:36:37 AM By: Christin Fudge MD, FACS Entered By: Christin Fudge on 10/09/2015 00:93:81 [name], [name] ([id number]) -------------------------------------------------------------------------------- Physical Exam Details Patient Name: [name], [name] [birth year] 01:75 Date of Service: AM Medical Record [id number] Number: Patient Account Number: [id number] 1925-08-08 (82 y.o. Treating RN: Macarthur Critchley Date of Birth/Sex: Female) Other Clinician: Primary Care Physician: [name] Treating Christin Fudge Referring Physician: [name] Physician/Extender: Suella Grove in Treatment: 0 Constitutional . Pulse regular. Respirations normal and unlabored. Afebrile. . Eyes Nonicteric. Reactive to light. Ears, Nose, Mouth, and Throat Lips, teeth, and gums WNL.Marland Kitchen Moist mucosa without lesions. Neck supple and nontender. No palpable supraclavicular or cervical  adenopathy. Respiratory WNL. No retractions.. Cardiovascular Pedal Pulses WNL. No clubbing, cyanosis or edema. Chest Breasts symmetical and no nipple discharge.. Breast tissue WNL, no masses, lumps, or tenderness.. Lymphatic No adneopathy. No adenopathy. No adenopathy. Musculoskeletal Adexa without tenderness or enlargement.. Digits and nails w/o clubbing, cyanosis, infection, petechiae, ischemia, or inflammatory conditions.. Integumentary (  Hair, Skin) No suspicious lesions. No crepitus or fluctuance. No peri-wound warmth or erythema. No masses.Marland Kitchen Psychiatric Judgement and insight Intact.. No evidence of depression, anxiety, or agitation.. Notes The dry eschar on the left lateral compartment distal intact and there is suggestions of the surrounding hematoma and seroma with ecchymosis. There is still no cellulitis or inflammation. Electronic Signature(s) Signed: 10/09/2015 11:37:21 AM By: Christin Fudge MD, FACS Entered By: Christin Fudge on 10/09/2015 54:65:03 [name], [name] ([id number]) -------------------------------------------------------------------------------- Physician Orders Details Patient Name: LORILEE, CAFARELLA 01/31/16 49:44 Date of Service: AM Medical Record [id number] Number: Patient Account Number: [id number] 1924/12/07 (82 y.o. Treating RN: Macarthur Critchley Date of Birth/Sex: Female) Other Clinician: Primary Care Physician: [name] Treating Christin Fudge Referring Physician: [name] Physician/Extender: Suella Grove in Treatment: 0 Verbal / Phone Orders: Yes Clinician: Macarthur Critchley Read Back and Verified: No Diagnosis Coding Wound Cleansing Wound #1 Left,Lateral Lower Leg o Clean wound with Normal Saline. Primary Wound Dressing Wound #1 Left,Lateral Lower Leg o Dry Gauze Secondary Dressing Wound #1 Left,Lateral Lower Leg o Gauze and Kerlix/Conform Follow-up Appointments Wound #1 Left,Lateral Lower Leg o Other: - follow up with  clinic after aspiration Electronic Signature(s) Signed: 10/09/2015 3:45:43 PM By: Ardean Larsen Signed: 10/09/2015 4:13:39 PM By: Christin Fudge MD, FACS Entered By: Rebecca Eaton RN, [place]ra on 10/09/2015 46:65:99 W[place], [name] ([id number]) -------------------------------------------------------------------------------- Problem List Details Patient Name: [name], [name] [birth year] 33:00 Date of Service: AM Medical Record [id number] Number: Patient Account Number: [id number] 1924/10/04 (82 y.o. 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 Treating RN: Macarthur Critchley Date of Birth/Sex: Female) Other Clinician: Primary Care Physician: [name] Treating Christin Fudge Referring Physician: [name] Physician/Extender: Suella Grove in Treatment: 0 Subjective Chief Complaint Information obtained from Patient Patient presents to the wound care center for a consult due non healing wound to the left lower extremity for about a month History of Present  Illness (HPI) The following HPI elements were documented for the patient's wound: Location: large swelling and wound of the left lower extremity Quality: Patient reports experiencing a dull pain to affected area(s). Severity: Patient states wound are getting worse. Duration: Patient has had the wound for < 4 weeks prior to presenting for treatment Timing: Pain in wound is constant (hurts all the time) Context: The wound occurred when the patient had a large heavy blunt object fall on her left leg Modifying Factors: Other treatment(s) tried include: has been on antibiotic ointment locally and orally Associated Signs and Symptoms: Patient reports having increase swelling. 81 year old patient who is known to have a nonhealing wound on the left lower leg has had this for about a month. She has been seen by her PCP and was on doxycycline earlier and now is on Augmentin. She is swelling of the leg and scab over this and the family requested a referral to the  wound center. Her past medical history significant for arthritis, anemia, cardiovascular disease, deafness, hypertension, colon polyps and throat cancer. 10/09/2015 -- Ultrasound of the left lower extremity done which showed IMPRESSION: Complex collection within the lateral left lower calf measuring at least 3 x 2.8 x 7 cm. This may represent an evolving hematoma, but correlate clinically as infection/ abscess is not excluded. Radiology had spoken to me about this patient after the ultrasound and I had requested a radiologist to see her for a aspiration of the hematoma under ultrasound guidance. this appointment is still pending and we are trying to facilitate this for the patient and her daughter Objective [name], [name] ([id number]) Constitutional Pulse regular. Respirations normal and unlabored. Afebrile. Vitals Time Taken: 10:52 AM, Temperature: 98.0 F, Pulse: 72 bpm, Blood Pressure: 110/78 mmHg. Eyes Nonicteric. Reactive to  light. Ears, Nose, Mouth, and Throat Lips, teeth, and gums WNL.Marland Kitchen Moist mucosa without lesions. Neck supple and nontender. No palpable supraclavicular or cervical adenopathy. Respiratory WNL. No retractions.. Cardiovascular Pedal Pulses WNL. No clubbing, cyanosis or edema. Chest Breasts symmetical and no nipple discharge.. Breast tissue WNL, no masses, lumps, or tenderness.. Lymphatic No adneopathy. No adenopathy. No adenopathy. Musculoskeletal Adexa without tenderness or enlargement.. Digits and nails w/o clubbing, cyanosis, infection, petechiae, ischemia, or inflammatory conditions.Marland Kitchen Psychiatric Judgement and insight Intact.. No evidence of depression, anxiety, or agitation.. General Notes: The dry eschar on the left lateral compartment distal intact and there is suggestions of the surrounding hematoma and seroma with ecchymosis. There is still no cellulitis or inflammation. Integumentary (Hair, Skin) No suspicious lesions. No crepitus or fluctuance. No peri-wound warmth or erythema. No masses.. Wound #1 status is Open. Original cause of wound was Trauma. The wound is located on the Left,Lateral Lower Leg. The wound measures 2.3cm length x 1.7cm width x 0cm depth; 3.071cm^2 area and 0.307cm^3 volume. The wound is limited to skin breakdown. There is a none present amount of drainage noted. The wound margin is distinct with the outline attached to the wound base. There is no granulation within the wound bed. There is a large (67-100%) amount of necrotic tissue within the wound bed including Eschar. The periwound skin appearance exhibited: Dry/Scaly. The periwound skin appearance did not exhibit: Callus, Crepitus, Excoriation, Fluctuance, Friable, Induration, Localized Edema, Rash, Scarring, Maceration, Moist, Atrophie Blanche, Cyanosis, Ecchymosis, Hemosiderin Staining, Mottled, Pallor, Rubor, Stahl, Hayla V. ([id number]) Erythema. Periwound temperature was noted as No  Abnormality. Assessment Active Problems ICD-10 M79.81 - Nontraumatic hematoma of soft tissue L97.222 - Non-pressure chronic ulcer of left calf with fat layer exposed Y29.Merril Abbe - Contact with blunt object, undetermined intent, initial encounter After reviewing the ultrasound with the patient and her daughter I have recommended a ultrasound-guided aspiration of the hematoma in the radiology department and we are doing everything to facilitate this. I have also discussed the possibility of this eschar opening out and having bleeding from the wound and if that happens she needs to go to the ER immediately for further surgical care appropriately. The patient's daughter understands this. Plan Wound Cleansing: Wound #1 Left,Lateral Lower Leg: Clean wound with Normal Saline. Primary Wound Dressing: Wound #1 Left,Lateral Lower Leg: Dry Gauze Secondary Dressing: Wound #1 Left,Lateral Lower Leg: Gauze and Kerlix/Conform Follow-up Appointments: Wound #1 Left,Lateral Lower Leg: Other: - follow up with clinic after aspiration [name], [name] ([id number]) After reviewing the ultrasound with the patient and her daughter I have recommended a ultrasound-guided aspiration of the hematoma in the radiology department and we are doing everything to  facilitate this. I have also discussed the possibility of this eschar opening out and having bleeding from the wound and if that happens she needs to go to the ER immediately for further surgical care appropriately. The patient's daughter understands this. Electronic Signature(s) Signed: 10/13/2015 9:17:12 AM By: Christin Fudge MD, FACS Previous Signature: 10/09/2015 11:38:20 AM Version By: Christin Fudge MD, FACS Entered By: Christin Fudge on 10/13/2015 12:81:18 [name], [name] ([id number]) -------------------------------------------------------------------------------- SuperBill Details Patient Name: [name], [name]. Date of Service: 10/09/2015 Medical  Record Number: [id number] Patient Account Number: [id number] Date of Birth/Sex: 1925/01/19 (82 y.o. Female) Treating RN: Macarthur Critchley Primary Care Physician: [name] Other Clinician: Referring Physician: [name] Treating Physician/Extender: Frann Rider in Treatment: 0 Diagnosis Coding ICD-10 Codes Code Description M79.81 Nontraumatic hematoma of soft tissue L97.222 Non-pressure chronic ulcer of left calf with fat layer exposed Y29.Merril Abbe Contact with blunt object, undetermined intent, initial encounter Facility Procedures CPT4 Code: [id number] Description: [phone] - WOUND CARE VISIT-LEV 3 EST PT Modifier: Quantity: 1 Physician Procedures CPT4 Code Description: [id number] - WC PHYS LEVEL 3 - EST PT ICD-10 Description Diagnosis M79.81 Nontraumatic hematoma of soft tissue L97.222 Non-pressure chronic ulcer of left calf with fat lay Y29.XXXA Contact with blunt object, undetermined intent,  init Modifier: er exposed ial encounter Quantity: 1 Electronic Signature(s) Signed: 10/09/2015 11:38:32 AM By: Christin Fudge MD, FACS Entered By: Christin Fudge on 10/09/2015 11:38:32

## 2015-10-09 NOTE — OR Nursing (Signed)
Pt on bp medication and pcn according to pt. However daughter is not available and pt is hard of hearing and cant read or write per pt. She is very limited in her hearing but can hear enough for consent. Black scab left lower lateral leg, with generalized swelling noted. Dr Laurence Ferrari in to consent patient.

## 2015-10-09 NOTE — Procedures (Signed)
Interventional Radiology Procedure Note  Procedure: US guided aspiration of left calf with removal of 100 mL old blood.   Complications: None  Estimated Blood Loss:  No acute  Recommendations: - Bedrest x  1hr - DC home  Signed,  Criselda Peaches, MD

## 2015-10-17 ENCOUNTER — Encounter: Payer: Medicare Other | Admitting: Surgery

## 2015-10-17 DIAGNOSIS — M7981 Nontraumatic hematoma of soft tissue: Secondary | ICD-10-CM | POA: Diagnosis not present

## 2015-10-17 NOTE — Progress Notes (Addendum)
[name] ([id number]) Visit Report for 10/17/2015 Chief Complaint Document Details Patient Name: [name], [name] [birth year] 80:29 Date of Service: AM Medical Record [id number] Number: Patient Account Number: [id number] November 19, 1924 (82 y.o. Treating RN: [name] Date of Birth/Sex: Female) Other Clinician: Primary Care Physician: [name] Treating [name] Referring Physician: [place] [name] Physician/Extender: [name] in Treatment: 2 Information Obtained from: Patient Chief Complaint Patient presents to the wound care center for a consult due non healing wound to the left lower extremity for about a month Electronic Signature(s) Signed: 10/17/2015 11:06:43 AM By: [name] Fudge MD, FACS Entered By: [name] on 10/17/2015 80:65:68 Spiro, [name] ([id number]) -------------------------------------------------------------------------------- HPI Details Patient Name: [name] [birth year] 75:91 Date of Service: AM Medical Record [id number] Number: Patient Account Number: [id number] [birth year] (82 y.o. Treating RN: [name] Date of Birth/Sex: Female) Other Clinician: Primary Care Physician: [name] Treating [name] Referring Physician: [place] [name] Physician/Extender: [name] in Treatment: 2 History of Present Illness Location: large swelling and wound of the left lower extremity Quality: Patient reports experiencing a dull pain to affected area(s). Severity: Patient states wound are getting worse. Duration: Patient has had the wound for < 4 weeks prior to presenting for treatment Timing: Pain in wound is constant (hurts all the time) Context: The wound occurred when the patient had a large heavy blunt object fall on her left leg Modifying Factors: Other treatment(s) tried include: has been on antibiotic ointment locally and orally Associated Signs and Symptoms: Patient reports having increase swelling. HPI Description:  82 year old patient who is known to have a nonhealing wound on the left lower leg has had this for about a month. She has been seen by her PCP and was on doxycycline earlier and now is on Augmentin. She is swelling of the leg and scab over this and the family requested a referral to the wound center. Her past medical history significant for arthritis, anemia, cardiovascular disease, deafness, hypertension, colon polyps and throat cancer. 10/09/2015 -- Ultrasound of the left lower extremity done which showed IMPRESSION: Complex collection within the lateral left lower calf measuring at least 3 x 2.8 x 7 cm. This may represent an evolving hematoma, but correlate clinically as infection/ abscess is not excluded. Radiology had spoken to me about this patient after the ultrasound and I had requested a radiologist to see her for a aspiration of the hematoma under ultrasound guidance. this appointment is still pending and we are trying to facilitate this for the patient and her daughter. 10/17/2015 -- had a ultrasound-guided aspiration on 10/09/2015, IMPRESSION: Successful aspiration yielding approximately 100 mL dark maroon blood. She is rapidly collected fluid back in the leg in spite of the aspiration. The wound is not open. Electronic Signature(s) Signed: 10/17/2015 11:07:26 AM By: [name] Fudge MD, FACS Previous Signature: 10/17/2015 8:49:51 AM Version By: [name] Fudge MD, FACS Entered By: [name] on 10/17/2015 35:70:17 [name], [name] ([id number]) -------------------------------------------------------------------------------- Physical Exam Details Patient Name: [name], [name] [birth year] 80:63 Date of Service: AM Medical Record [id number] Number: Patient Account Number: [id number] [birth year] (82 y.o. Treating RN: [name] Date of Birth/Sex: Female) Other Clinician: Primary Care Physician: [name] Treating [name] Referring Physician: Maryland [name] Physician/Extender: [name] in Treatment: 2 Constitutional . Pulse regular. Respirations normal and unlabored. Afebrile. . Eyes Nonicteric. Reactive to light. Ears, Nose, Mouth, and Throat Lips, teeth, and gums WNL.Marland Kitchen Moist mucosa without lesions. Neck supple and nontender. No palpable supraclavicular or cervical adenopathy. Normal sized without goiter. Respiratory WNL.  No retractions.. Cardiovascular Pedal Pulses WNL. No clubbing, cyanosis or edema. Lymphatic No adneopathy. No adenopathy. No adenopathy. Musculoskeletal Adexa without tenderness or enlargement.. Digits and nails w/o clubbing, cyanosis, infection, petechiae, ischemia, or inflammatory conditions.. Integumentary (Hair, Skin) No suspicious lesions. No crepitus or fluctuance. No peri-wound warmth or erythema. No masses.Marland Kitchen Psychiatric Judgement and insight Intact.. No evidence of depression, anxiety, or agitation.. Notes the left lower extremity has a ballotable seroma which is re accumulated rapidly since the last aspiration of 100 mL of fluid done in interventional radiology. the eschar is still intact Electronic Signature(s) Signed: 10/17/2015 11:08:35 AM By: [name] Fudge MD, FACS Entered By: [name] on 10/17/2015 09:32:67 [name], [name] ([id number]) -------------------------------------------------------------------------------- Physician Orders Details Patient Name: [name], [name] [birth year] 80:76 Date of Service: AM Medical Record [id number] Number: Patient Account Number: [id number] 16-Apr-1925 (82 y.o. Treating RN: [name] Date of Birth/Sex: Female) Other Clinician: Primary Care Physician: [name] Treating [name] Referring Physician: Maryland [name] Physician/Extender: [name] in Treatment: 2 Verbal / Phone Orders: Yes Clinician: Carolyne [name], [name] Read Back and Verified: Yes Diagnosis Coding Wound Cleansing Wound #1 Left,Lateral Lower Leg o Clean wound with Normal  Saline. Primary Wound Dressing Wound #1 Left,Lateral Lower Leg o Dry Gauze Secondary Dressing Wound #1 Left,Lateral Lower Leg o Gauze and Kerlix/Conform [facility] Electronic Signature(s) Signed: 10/17/2015 3:33:01 PM By: [name] Fudge MD, FACS Signed: 10/18/2015 5:03:09 PM By: Alric Quan Entered By: Alric Quan on 10/17/2015 24:09:73 Simic, [name] ([id number]) -------------------------------------------------------------------------------- Problem List Details Patient Name: [name], [name] 8/34/1960 22:97 Date of Service: AM Medical Record [id number] Number: Patient Account Number: [id number] July 29, 1925 (82 y.o. 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/03/2015 Yes exposed Y29.XXXA Contact with blunt object, undetermined intent, initial 10/03/2015 Yes encounter Inactive Problems Resolved Problems Electronic Signature(s) Signed: 10/17/2015 3:33:01 PM By: [name] Fudge MD, FACS Signed: 10/18/2015 5:03:09 PM By: Alric Quan Previous Signature: 10/17/2015 11:06:36 AM Version By: [name] Fudge MD, FACS Entered By: Alric Quan on 10/17/2015 74:08:14 Nicastro, [name] ([id number]) -------------------------------------------------------------------------------- Progress Note Details Patient Name: [name], [name] [birth year] 85:88 Date of Service: AM Medical Record [id number] Number: Patient Account Number: [id number] Nov 03, 1924 (82 y.o. Treating RN: [name] Date of Birth/Sex: Female) Other Clinician: Primary Care Physician: [name]  Treating [name] Referring Physician: Maryland [name] Physician/Extender: [name] in Treatment: 2 Subjective Chief Complaint Information obtained from Patient Patient presents to the wound care center for a consult due non healing wound to the left lower extremity for about a month History of Present Illness (HPI) The following HPI elements were documented for the patient's wound: Location: large swelling and wound of the left lower extremity Quality: Patient reports experiencing a dull pain to affected area(s). Severity: Patient states wound are getting worse. Duration: Patient has had the wound for < 4 weeks prior to presenting for treatment Timing: Pain in wound is constant (hurts all the time) Context: The wound occurred when the patient had a large heavy blunt object fall on her left leg Modifying Factors: Other treatment(s) tried include: has been on antibiotic ointment locally and orally Associated Signs and Symptoms: Patient reports having increase swelling. 82 year old patient who is known to have a nonhealing wound  on the left lower leg has had this for about a month. She has been seen by her PCP and was on doxycycline earlier and now is on Augmentin. She is swelling of the leg and scab over this and the family requested a referral to the wound center. Her past medical history significant for arthritis, anemia, cardiovascular disease, deafness, hypertension, colon polyps and throat cancer. 10/09/2015 -- Ultrasound of the left lower extremity done which showed IMPRESSION: Complex collection within the lateral left lower calf measuring at least 3 x 2.8 x 7 cm. This may represent an evolving hematoma, but correlate clinically as infection/ abscess is not excluded. Radiology had spoken to me about this patient after the ultrasound and I had requested a radiologist to see her for a aspiration of the hematoma under ultrasound guidance. this appointment is still pending and we  are trying to facilitate this for the patient and her daughter. 10/17/2015 -- had a ultrasound-guided aspiration on 10/09/2015, IMPRESSION: Successful aspiration yielding approximately 100 mL dark maroon blood. She is rapidly collected fluid back in the leg in spite of the aspiration. The wound is not open. [name], [name] ([id number]) Objective Constitutional Pulse regular. Respirations normal and unlabored. Afebrile. Vitals Time Taken: 10:43 AM, Temperature: 97.8 F, Pulse: 66 bpm, Respiratory Rate: 16 breaths/min, Blood Pressure: 110/58 mmHg. Eyes Nonicteric. Reactive to light. Ears, Nose, Mouth, and Throat Lips, teeth, and gums WNL.Marland Kitchen Moist mucosa without lesions. Neck supple and nontender. No palpable supraclavicular or cervical adenopathy. Normal sized without goiter. Respiratory WNL. No retractions.. Cardiovascular Pedal Pulses WNL. No clubbing, cyanosis or edema. Lymphatic No adneopathy. No adenopathy. No adenopathy. Musculoskeletal Adexa without tenderness or enlargement.. Digits and nails w/o clubbing, cyanosis, infection, petechiae, ischemia, or inflammatory conditions.Marland Kitchen Psychiatric Judgement and insight Intact.. No evidence of depression, anxiety, or agitation.. General Notes: the left lower extremity has a ballotable seroma which is re accumulated rapidly since the last aspiration of 100 mL of fluid done in interventional radiology. the eschar is still intact Integumentary (Hair, Skin) No suspicious lesions. No crepitus or fluctuance. No peri-wound warmth or erythema. No masses.. Wound #1 status is Open. Original cause of wound was Trauma. The wound is located on the Left,Lateral Lower Leg. The wound measures 2.4cm length x 1.8cm width x 0cm depth; 3.393cm^2 area and 0.339cm^3 volume. The wound is limited to skin breakdown. There is no tunneling or undermining noted. There is a none present amount of drainage noted. The wound margin is distinct with the outline  attached to the wound base. There is no granulation within the wound bed. There is a large (67-100%) amount of necrotic tissue within the wound bed including Eschar. The periwound skin appearance exhibited: [name], [name] ([id number]) Dry/Scaly. The periwound skin appearance did not exhibit: Callus, Crepitus, Excoriation, Fluctuance, Friable, Induration, Localized Edema, Rash, Scarring, Maceration, Moist, Atrophie Blanche, Cyanosis, Ecchymosis, Hemosiderin Staining, Mottled, Pallor, Rubor, Erythema. Periwound temperature was noted as No Abnormality. Assessment Active Problems ICD-10 M79.81 - Nontraumatic hematoma of soft tissue L97.222 - Non-pressure chronic ulcer of left calf with fat layer exposed Y29.Merril Abbe - Contact with blunt object, undetermined intent, initial encounter In view of the rapid collection of the seroma I have recommended an opinion from [facility] to discuss drainage of this hematoma in the OR with either placement of a JP drain or a wound VAC. She will come back and see me as needed after her operative opinion Plan Wound Cleansing: Wound #1 Left,Lateral Lower Leg: Clean wound with Normal Saline. Primary Wound  Dressing: Wound #1 Left,Lateral Lower Leg: Dry Gauze Secondary Dressing: Wound #1 Left,Lateral Lower Leg: Gauze and Kerlix/Conform Consults ordered were: General Surgery - [facility] In view of the rapid collection of the seroma I have recommended an opinion from [facility] to discuss drainage of this hematoma in the OR with either placement of a JP drain or a wound VAC. [name], [name] ([id number]) She will come back and see me as needed after her operative opinion Electronic Signature(s) Signed: 10/17/2015 3:37:47 PM By: [name] Fudge MD, FACS Previous Signature: 10/17/2015 11:09:49 AM Version By: [name] Fudge MD, FACS Entered By: [name] on 10/17/2015 62:13:08 [name], [name]  ([id number]) -------------------------------------------------------------------------------- [place] Details Patient Name: [name], [name]. Date of Service: 10/17/2015 Medical Record Number: [id number] Patient Account Number: [id number] Date of Birth/Sex: June 01, 1925 (82 y.o. Female) Treating RN: [name], [name] Primary Care Physician: [name] Other Clinician: Referring Physician: [place] [name] Treating Physician/Extender: Frann Rider in Treatment: 2 Diagnosis Coding ICD-10 Codes Code Description M79.81 Nontraumatic hematoma of soft tissue L97.222 Non-pressure chronic ulcer of left calf with fat layer exposed Y29.Merril Abbe Contact with blunt object, undetermined intent, initial encounter Facility Procedures CPT4 Code: [id number] Description: [id number] - WOUND CARE VISIT-LEV 2 EST PT Modifier: Quantity: 1 Physician Procedures CPT4 Code Description: [id number] - WC PHYS LEVEL 3 - EST PT ICD-10 Description Diagnosis M79.81 Nontraumatic hematoma of soft tissue L97.222 Non-pressure chronic ulcer of left calf with fat lay Y29.XXXA Contact with blunt object, undetermined intent,  init Modifier: er exposed ial encounter Quantity: 1 Electronic Signature(s) Signed: 10/17/2015 3:33:01 PM By: [name] Fudge MD, FACS Signed: 10/18/2015 5:03:09 PM By: Alric Quan Previous Signature: 10/17/2015 11:10:11 AM Version By: [name] Fudge MD, FACS Entered By: Alric Quan on 10/17/2015 15:09:48

## 2015-10-19 NOTE — Progress Notes (Signed)
MAYRENE, BASTARACHE ([id number]) Visit Report for 10/17/2015 Arrival Information Details Patient Name: [name], [name]. Date of Service: 10/17/2015 10:45 AM Medical Record Number: [id number] Patient Account Number: [id number] Date of Birth/Sex: [birth year] (82 y.o. Female) Treating RN: Carolyne Fiscal, Debi Primary Care Physician: [name] Other Clinician: Referring Physician: [name] Treating Physician/Extender: Frann Rider in Treatment: 2 Visit Information History Since Last Visit All ordered tests and consults were completed: No Patient Arrived: Wheel Chair Added or deleted any medications: No Arrival Time: 10:42 Any new allergies or adverse reactions: No Accompanied By: daughter Had a fall or experienced change in No Transfer Assistance: EasyPivot activities of daily living that may affect Patient Lift risk of falls: Patient Identification Verified: Yes Signs or symptoms of abuse/neglect since last No Secondary Verification Process Yes visito Completed: Hospitalized since last visit: No Patient Requires Transmission- No Pain Present Now: No Based Precautions: Patient Has Alerts: No Electronic Signature(s) Signed: 10/18/2015 5:03:09 PM By: Alric Quan Entered By: Alric Quan on 10/17/2015 95:63:87 [place], [place]. ([id number]) -------------------------------------------------------------------------------- Clinic Level of Care Assessment Details Patient Name: Kuhar, Beula V. Date of Service: 10/17/2015 10:45 AM Medical Record Number: [id number] Patient Account Number: [id number] Date of Birth/Sex: Jun 21, 1925 (82 y.o. Female) Treating RN: Carolyne Fiscal, Debi Primary Care Physician: [name] Other Clinician: Referring Physician: [name] Treating Physician/Extender: Frann Rider in Treatment: 2 Clinic Level of Care Assessment Items TOOL 4 Quantity Score '[]'$  - Use when only an EandM is performed on FOLLOW-UP visit 0 ASSESSMENTS -  Nursing Assessment / Reassessment '[]'$  - Reassessment of Co-morbidities (includes updates in patient status) 0 X - Reassessment of Adherence to Treatment Plan 1 5 ASSESSMENTS - Wound and Skin Assessment / Reassessment X - Simple Wound Assessment / Reassessment - one wound 1 5 '[]'$  - Complex Wound Assessment / Reassessment - multiple wounds 0 '[]'$  - Dermatologic / Skin Assessment (not related to wound area) 0 ASSESSMENTS - Focused Assessment '[]'$  - Circumferential Edema Measurements - multi extremities 0 '[]'$  - Nutritional Assessment / Counseling / Intervention 0 '[]'$  - Lower Extremity Assessment (monofilament, tuning fork, pulses) 0 '[]'$  - Peripheral Arterial Disease Assessment (using hand held doppler) 0 ASSESSMENTS - Ostomy and/or Continence Assessment and Care '[]'$  - Incontinence Assessment and Management 0 '[]'$  - Ostomy Care Assessment and Management (repouching, etc.) 0 PROCESS - Coordination of Care X - Simple Patient / Family Education for ongoing care 1 15 '[]'$  - Complex (extensive) Patient / Family Education for ongoing care 0 '[]'$  - Staff obtains Programmer, systems, Records, Test Results / Process Orders 0 '[]'$  - Staff telephones HHA, Nursing Homes / Clarify orders / etc 0 '[]'$  - Routine Transfer to another Facility (non-emergent condition) 0 Brierley, Shawne V. ([id number]) '[]'$  - Routine Hospital Admission (non-emergent condition) 0 '[]'$  - New Admissions / Biomedical engineer / Ordering NPWT, Apligraf, etc. 0 '[]'$  - Emergency Hospital Admission (emergent condition) 0 X - Simple Discharge Coordination 1 10 '[]'$  - Complex (extensive) Discharge Coordination 0 PROCESS - Special Needs '[]'$  - Pediatric / Minor Patient Management 0 '[]'$  - Isolation Patient Management 0 '[]'$  - Hearing / Language / Visual special needs 0 '[]'$  - Assessment of Community assistance (transportation, D/C planning, etc.) 0 '[]'$  - Additional assistance / Altered mentation 0 '[]'$  - Support Surface(s) Assessment (bed, cushion, seat, etc.) 0 INTERVENTIONS -  Wound Cleansing / Measurement X - Simple Wound Cleansing - one wound 1 5 '[]'$  - Complex Wound Cleansing - multiple wounds 0 X - Wound Imaging (photographs - any number of wounds) 1  5 '[]'$  - Wound Tracing (instead of photographs) 0 X - Simple Wound Measurement - one wound 1 5 '[]'$  - Complex Wound Measurement - multiple wounds 0 INTERVENTIONS - Wound Dressings X - Small Wound Dressing one or multiple wounds 1 10 '[]'$  - Medium Wound Dressing one or multiple wounds 0 '[]'$  - Large Wound Dressing one or multiple wounds 0 X - Application of Medications - topical 1 5 '[]'$  - Application of Medications - injection 0 INTERVENTIONS - Miscellaneous '[]'$  - External ear exam 0 Marceaux, Naoma V. ([id number]) '[]'$  - Specimen Collection (cultures, biopsies, blood, body fluids, etc.) 0 '[]'$  - Specimen(s) / Culture(s) sent or taken to Lab for analysis 0 '[]'$  - Patient Transfer (multiple staff / Harrel Lemon Lift / Similar devices) 0 '[]'$  - Simple Staple / Suture removal (25 or less) 0 '[]'$  - Complex Staple / Suture removal (26 or more) 0 '[]'$  - Hypo / Hyperglycemic Management (close monitor of Blood Glucose) 0 '[]'$  - Ankle / Brachial Index (ABI) - do not check if billed separately 0 X - Vital Signs 1 5 Has the patient been seen at the hospital within the last three years: Yes Total Score: 70 Level Of Care: New/Established - Level 2 Electronic Signature(s) Signed: 10/18/2015 5:03:09 PM By: Alric Quan Entered By: Alric Quan on 10/17/2015 08:65:78 Koestner, [name] ([id number]) -------------------------------------------------------------------------------- Encounter Discharge Information Details Patient Name: Duet, Aliveah V. Date of Service: 10/17/2015 10:45 AM Medical Record Number: [id number] Patient Account Number: [id number] Date of Birth/Sex: Mar 07, 1925 (82 y.o. Female) Treating RN: Carolyne Fiscal, Debi Primary Care Physician: [name] Other Clinician: Referring Physician: [name] Treating Physician/Extender:  Frann Rider in Treatment: 2 Encounter Discharge Information Items Discharge Pain Level: 0 Discharge Condition: Stable Ambulatory Status: Wheelchair Discharge Destination: Home Transportation: Private Auto Accompanied By: daughter Schedule Follow-up Appointment: No Medication Reconciliation completed and provided to Patient/Care Yes Vaudine Dutan: Provided on Clinical Summary of Care: 10/17/2015 Form Type Recipient Paper Patient [facility] Electronic Signature(s) Signed: 10/17/2015 11:06:12 AM By: Ruthine Dose Entered By: Ruthine Dose on 10/17/2015 27:25:36 [place], [name] ([id number]) -------------------------------------------------------------------------------- Lower Extremity Assessment Details Patient Name: Bayus, [name]. Date of Service: 10/17/2015 10:45 AM Medical Record Number: [id number] Patient Account Number: [id number] Date of Birth/Sex: [birth year] (82 y.o. Female) Treating RN: Carolyne Fiscal, Debi Primary Care Physician: [name] Other Clinician: Referring Physician: [name] Treating Physician/Extender: Frann Rider in Treatment: 2 Edema Assessment Assessed: [Left: No] [Right: No] E[Left: dema] [Right: :] Calf Left: Right: Point of Measurement: cm From Medial Instep 36 cm cm Ankle Left: Right: Point of Measurement: cm From Medial Instep 21.2 cm cm Vascular Assessment Pulses: Posterior Tibial Dorsalis Pedis Palpable: [Left:Yes] Extremity colors, hair growth, and conditions: Extremity Color: [Left:Hyperpigmented] Temperature of Extremity: [Left:Warm] Capillary Refill: [Left:< 3 seconds] Toe Nail Assessment Left: Right: Thick: No Discolored: No Deformed: No Improper Length and Hygiene: No Electronic Signature(s) Signed: 10/18/2015 5:03:09 PM By: Alric Quan Entered By: Alric Quan on 10/17/2015 43:32:95 Manard, Zala Clayton Bibles ([id number]) -------------------------------------------------------------------------------- Multi Wound  Chart Details Patient Name: Teasdale, Catherin V. Date of Service: 10/17/2015 10:45 AM Medical Record Number: [id number] Patient Account Number: [id number] Date of Birth/Sex: [birth year] (82 y.o. Female) Treating RN: Carolyne Fiscal, Debi Primary Care Physician: [name] Other Clinician: Referring Physician: [name] Treating Physician/Extender: Frann Rider in Treatment: 2 Vital Signs Height(in): Pulse(bpm): 66 Weight(lbs): Blood Pressure 110/58 (mmHg): Body Mass Index(BMI): Temperature(F): 97.8 Respiratory Rate 16 (breaths/min): Photos: [1:No Photos] [N/A:N/A] Wound Location: [1:Left Lower Leg - Lateral] [N/A:N/A] Wounding Event: [1:Trauma] [N/A:N/A] Primary Etiology: [1:Trauma,  Other] [N/A:N/A] Comorbid History: [1:Cataracts, Anemia, Coronary Artery Disease, Hypertension, Osteoarthritis] [N/A:N/A] Date Acquired: [1:09/08/2015] [N/A:N/A] Weeks of Treatment: [1:2] [N/A:N/A] Wound Status: [1:Open] [N/A:N/A] Measurements L x W x D 2.4x1.8x0 [N/A:N/A] (cm) Area (cm) : [1:3.393] [N/A:N/A] Volume (cm) : [1:0.339] [N/A:N/A] % Reduction in Area: [1:11.10%] [N/A:N/A] % Reduction in Volume: 11.30% [N/A:N/A] Classification: [1:Unclassifiable] [N/A:N/A] Exudate Amount: [1:None Present] [N/A:N/A] Wound Margin: [1:Distinct, outline attached] [N/A:N/A] Granulation Amount: [1:None Present (0%)] [N/A:N/A] Necrotic Amount: [1:Large (67-100%)] [N/A:N/A] Necrotic Tissue: [1:Eschar] [N/A:N/A] Exposed Structures: [1:Fascia: No Fat: No Tendon: No Muscle: No Joint: No Bone: No] [N/A:N/A] Limited to Skin Breakdown Epithelialization: None N/A N/A Periwound Skin Texture: Edema: No N/A N/A Excoriation: No Induration: No Callus: No Crepitus: No Fluctuance: No Friable: No Rash: No Scarring: No Periwound Skin Dry/Scaly: Yes N/A N/A Moisture: Maceration: No Moist: No Periwound Skin Color: Atrophie Blanche: No N/A N/A Cyanosis: No Ecchymosis: No Erythema: No Hemosiderin  Staining: No Mottled: No Pallor: No Rubor: No Temperature: No Abnormality N/A N/A Tenderness on No N/A N/A Palpation: Wound Preparation: Ulcer Cleansing: N/A N/A Rinsed/Irrigated with Saline Treatment Notes Electronic Signature(s) Signed: 10/18/2015 5:03:09 PM By: Alric Quan Entered By: Alric Quan on 10/17/2015 32:95:18 Minix, [name] ([id number]) -------------------------------------------------------------------------------- [place] Details Patient Name: Goucher, [name]. Date of Service: 10/17/2015 10:45 AM Medical Record Number: [id number] Patient Account Number: [id number] Date of Birth/Sex: June 28, 1925 (82 y.o. Female) Treating RN: Carolyne Fiscal, Debi Primary Care Physician: [name] Other Clinician: Referring Physician: [name] Treating Physician/Extender: Frann Rider in Treatment: 2 Active Inactive Abuse / Safety / Falls / Self Care Management Nursing Diagnoses: Impaired home maintenance Impaired physical mobility Potential for falls Self care deficit: actual or potential Goals: Patient will remain injury free Date Initiated: 10/03/2015 Goal Status: Active Patient/caregiver will verbalize understanding of skin care regimen Date Initiated: 10/03/2015 Goal Status: Active Patient/caregiver will verbalize/demonstrate measure taken to improve self care Date Initiated: 10/03/2015 Goal Status: Active Patient/caregiver will verbalize/demonstrate measures taken to improve the patient's personal safety Date Initiated: 10/03/2015 Goal Status: Active Patient/caregiver will verbalize/demonstrate measures taken to prevent injury and/or falls Date Initiated: 10/03/2015 Goal Status: Active Patient/caregiver will verbalize/demonstrate understanding of what to do in case of emergency Date Initiated: 10/03/2015 Goal Status: Active Interventions: Assess fall risk on admission and as needed Assess: immobility, friction, shearing,  incontinence upon admission and as needed Assess impairment of mobility on admission and as needed per policy Assess self care needs on admission and as needed Patient referred to community resources (specify in notes) Provide education on basic hygiene Neale, Gabriellah V. ([id number]) Provide education on fall prevention Provide education on personal and home safety Provide education on safe transfers Treatment Activities: Education provided on Basic Hygiene : 10/03/2015 Notes: Orientation to the Wound Care Program Nursing Diagnoses: Knowledge deficit related to the wound healing center program Goals: Patient/caregiver will verbalize understanding of the [facility] Date Initiated: 10/03/2015 Goal Status: Active Interventions: Provide education on orientation to the wound center Notes: Wound/Skin Impairment Nursing Diagnoses: Impaired tissue integrity Knowledge deficit related to smoking impact on wound healing Knowledge deficit related to ulceration/compromised skin integrity Goals: Patient/caregiver will verbalize understanding of skin care regimen Date Initiated: 10/03/2015 Goal Status: Active Ulcer/skin breakdown will have a volume reduction of 30% by week 4 Date Initiated: 10/03/2015 Goal Status: Active Ulcer/skin breakdown will have a volume reduction of 50% by week 8 Date Initiated: 10/03/2015 Goal Status: Active Ulcer/skin breakdown will have a volume reduction of 80% by week 12 Date Initiated: 10/03/2015 Goal  Status: Active Ulcer/skin breakdown will heal within 14 weeks Date Initiated: 11/05/2540 HAGEN, TIDD ([id number]) Goal Status: Active Interventions: Assess patient/caregiver ability to obtain necessary supplies Assess patient/caregiver ability to perform ulcer/skin care regimen upon admission and as needed Assess ulceration(s) every visit Provide education on ulcer and skin care Notes: Electronic Signature(s) Signed: 10/18/2015 5:03:09 PM  By: Alric Quan Entered By: Alric Quan on 10/17/2015 31:51:76 Alfred, Teonna Clayton Bibles ([id number]) -------------------------------------------------------------------------------- Pain Assessment Details Patient Name: Gentry, [name]. Date of Service: 10/17/2015 10:45 AM Medical Record Number: [id number] Patient Account Number: [id number] Date of Birth/Sex: 09/19/1924 (82 y.o. Female) Treating RN: Carolyne Fiscal, Debi Primary Care Physician: [name] Other Clinician: Referring Physician: [name] Treating Physician/Extender: Frann Rider in Treatment: 2 Active Problems Location of Pain Severity and Description of Pain Patient Has Paino No Site Locations Pain Management and Medication Current Pain Management: Electronic Signature(s) Signed: 10/18/2015 5:03:09 PM By: Alric Quan Entered By: Alric Quan on 10/17/2015 70:35:00 [place], [name] ([id number]) -------------------------------------------------------------------------------- Patient/Caregiver Education Details Patient Name: [name]. Date of Service: 10/17/2015 10:45 AM Medical Record Number: [id number] Patient Account Number: [id number] Date of Birth/Gender: 12-11-1924 (82 y.o. Female) Treating RN: Carolyne Fiscal, Debi Primary Care Physician: [name] Other Clinician: Referring Physician: [name] Treating Physician/Extender: Frann Rider in Treatment: 2 Education Assessment Education Provided To: Patient and Caregiver Education Topics Provided Wound/Skin Impairment: Handouts: Other: follow up with surgeon Methods: Demonstration, Explain/Verbal Responses: State content correctly Electronic Signature(s) Signed: 10/18/2015 5:03:09 PM By: Alric Quan Entered By: Alric Quan on 10/17/2015 81:01:75 Lackie, Carollyn Clayton Bibles ([id number]) -------------------------------------------------------------------------------- Wound Assessment Details Patient Name: Reister, [name]. Date of Service: 10/17/2015 10:45 AM Medical Record Number: [id number] Patient Account Number: [id number] Date of Birth/Sex: 1925/08/11 (82 y.o. Female) Treating RN: Carolyne Fiscal, Debi Primary Care Physician: [name] Other Clinician: Referring Physician: [name] Treating Physician/Extender: Frann Rider in Treatment: 2 Wound Status Wound Number: 1 Primary Trauma, Other Etiology: Wound Location: Left Lower Leg - Lateral Wound Open Wounding Event: Trauma Status: Date Acquired: 09/08/2015 Comorbid Cataracts, Anemia, Coronary Artery Weeks Of Treatment: 2 History: Disease, Hypertension, Osteoarthritis Clustered Wound: No Photos Photo Uploaded By: Alric Quan on 10/17/2015 15:44:07 Wound Measurements Length: (cm) 2.4 % Redu Width: (cm) 1.8 % Redu Depth: (cm) 0 Epithe Area: (cm) 3.393 [place] Volume: (cm) 0.339 Under Zero values entered for Length, Width or Depth are replaced with 0.1 to facilitate the calculation of Area and Volume. ction in Area: 11.1% ction in Volume: 11.3% lialization: None ling: No mining: No Wound Description Classification: Unclassifiable Wound Margin: Distinct, outline attached Exudate Amount: None Present Foul Odor After Cleansing: No Wound Bed Granulation Amount: None Present (0%) Exposed Structure Necrotic Amount: Large (67-100%) Fascia Exposed: No Necrotic Quality: Eschar Fat Layer Exposed: No Tendon Exposed: No [place], Amori V. ([id number]) Muscle Exposed: No Joint Exposed: No Bone Exposed: No Limited to Skin Breakdown Periwound Skin Texture Texture Color No Abnormalities Noted: No No Abnormalities Noted: No Callus: No Atrophie Blanche: No Crepitus: No Cyanosis: No Excoriation: No Ecchymosis: No Fluctuance: No Erythema: No Friable: No Hemosiderin Staining: No Induration: No Mottled: No Localized Edema: No Pallor: No Rash: No Rubor: No Scarring: No Temperature / Pain Moisture Temperature: No  Abnormality No Abnormalities Noted: No Dry / Scaly: Yes Maceration: No Moist: No Wound Preparation Ulcer Cleansing: Rinsed/Irrigated with Saline Topical Anesthetic Applied: Other: lidocaine 4%, Assessment Notes Pt has a noticeable amount of swelling around the wound Treatment Notes Wound #1 (Left, Lateral Lower Leg) 1. Cleansed with: Clean wound with  Normal Saline 2. Anesthetic Topical Lidocaine 4% cream to wound bed prior to debridement 5. Secondary Dressing Applied Guaze, ABD and kerlix/Conform 7. Secured with Recruitment consultant) Signed: 10/18/2015 5:03:09 PM By: Alric Quan Entered By: Alric Quan on 10/17/2015 76:18:48 [place], [name] ([id number]) -------------------------------------------------------------------------------- [place] Details Patient Name: Kille, [name]. Date of Service: 10/17/2015 10:45 AM Medical Record Number: [id number] Patient Account Number: [id number] Date of Birth/Sex: 31-Oct-1924 (82 y.o. Female) Treating RN: Carolyne Fiscal, Debi Primary Care Physician: [name] Other Clinician: Referring Physician: [name] Treating Physician/Extender: Frann Rider in Treatment: 2 Vital Signs Time Taken: 10:43 Temperature (F): 97.8 Pulse (bpm): 66 Respiratory Rate (breaths/min): 16 Blood Pressure (mmHg): 110/58 Reference Range: 80 - 120 mg / dl Electronic Signature(s) Signed: 10/18/2015 5:03:09 PM By: Alric Quan Entered By: Alric Quan on 10/17/2015 10:44:48

## 2015-10-20 ENCOUNTER — Encounter: Payer: Self-pay | Admitting: Surgery

## 2015-10-20 ENCOUNTER — Ambulatory Visit (INDEPENDENT_AMBULATORY_CARE_PROVIDER_SITE_OTHER): Payer: Medicare Other | Admitting: Surgery

## 2015-10-20 VITALS — BP 148/86 | HR 61 | Temp 98.6°F | Ht 66.0 in | Wt 115.0 lb

## 2015-10-20 DIAGNOSIS — Z86010 Personal history of colonic polyps: Secondary | ICD-10-CM | POA: Insufficient documentation

## 2015-10-20 DIAGNOSIS — H9190 Unspecified hearing loss, unspecified ear: Secondary | ICD-10-CM | POA: Insufficient documentation

## 2015-10-20 DIAGNOSIS — D649 Anemia, unspecified: Secondary | ICD-10-CM | POA: Insufficient documentation

## 2015-10-20 DIAGNOSIS — C140 Malignant neoplasm of pharynx, unspecified: Secondary | ICD-10-CM | POA: Insufficient documentation

## 2015-10-20 DIAGNOSIS — I10 Essential (primary) hypertension: Secondary | ICD-10-CM | POA: Insufficient documentation

## 2015-10-20 DIAGNOSIS — I2510 Atherosclerotic heart disease of native coronary artery without angina pectoris: Secondary | ICD-10-CM | POA: Insufficient documentation

## 2015-10-20 DIAGNOSIS — H25019 Cortical age-related cataract, unspecified eye: Secondary | ICD-10-CM | POA: Insufficient documentation

## 2015-10-20 DIAGNOSIS — M1990 Unspecified osteoarthritis, unspecified site: Secondary | ICD-10-CM | POA: Insufficient documentation

## 2015-10-20 DIAGNOSIS — S8012XA Contusion of left lower leg, initial encounter: Secondary | ICD-10-CM | POA: Diagnosis not present

## 2015-10-20 NOTE — Progress Notes (Signed)
Patient ID: [name], female   DOB: 04-Jul-1925, 82 y.o.   MRN: [id number]  History of Present Illness [name] is a 82 y.o. female for by Dr. Jeanette Caprice secondary to a large deep left leg hematoma. Probably at TV fell into her left leg on 09/08/2015 and the patient developed a hematoma on the left leg. His was actually aspirated under ultrasound by radiology couple weeks ago and now the hematoma came back. She experienced intermittent mild to moderate pain, nonradiating and sharp in nature. No evidence of infection no evidence of any other active bleeding or constitutional symptoms. She had a history of a stroke and takes a daily aspirin  Past Medical History Past Medical History  Diagnosis Date  . Anemia   . Arthritis   . Cataract cortical, senile   . CVD (cardiovascular disease)   . Hearing loss of both ears     wears hearing aides  . Hypertension   . History of colonic polyps   . Throat cancer ([place]) 2005  . Unsteady gait   . Polycythemia      Past Surgical History  Procedure Laterality Date  . Joint replacement Left     11-12 years ago secondary to some from children    Allergies  Allergen Reactions  . Atorvastatin Nausea Only    Current Outpatient Prescriptions  Medication Sig Dispense Refill  . amLODipine (NORVASC) 10 MG tablet Take by mouth.    Marland Kitchen aspirin EC 325 MG tablet Take by mouth.    . hydrochlorothiazide (HYDRODIURIL) 25 MG tablet Take by mouth.    . pravastatin (PRAVACHOL) 40 MG tablet Take by mouth.     No current facility-administered medications for this visit.    Family History Family History  Problem Relation Age of Onset  . Hypertension Mother   . Hypertension Sister   . Diabetes Mellitus II Sister       Social History Social History  Substance Use Topics  . Smoking status: Former Smoker -- 1.00 packs/day for 50 years    Types: Cigarettes    Quit date: 09/17/2003  . Smokeless tobacco: Current User    Types: Snuff  . Alcohol Use:  3.0 oz/week    0 Standard drinks or equivalent, 5 Cans of beer per week     Comment: "beer"    ROS  Upon review of system was performed and is otherwise negative  Physical Exam Blood pressure 148/86, pulse 61, temperature 98.6 F (37 C), temperature source Oral, height '5\' 6"'$  (1.676 m), weight 52.164 kg (115 lb).  CONSTITUTIONAL: NAD, debilitated and comes in a wheelchair NECK: Trachea is midline, and there is no jugular venous distension. Thyroid is without palpable abnormalities. LYMPH NODES:  Lymph nodes in the neck are not enlarged. RESPIRATORY:  Lungs are clear, and breath sounds are equal bilaterally. Normal respiratory effort without pathologic use of accessory muscles. CARDIOVASCULAR: Heart is regular without murmurs, gallops, or rubs. GI: The abdomen is soft, nontender, and nondistended. There were no palpable masses. There was no hepatosplenomegaly. There were normal bowel sounds. SKIN: Skin turgor is normal. There are no pathologic skin lesions.  NEUROLOGIC:  Motor and sensation is grossly normal.  Cranial nerves are grossly intact. PSYCH:  Alert and oriented to person, place and time. Affect is normal. EXT: Initial large deep hematoma in the left leg is fluctuant there is no evidence of infection there is an all On the lateral aspect. Mildly tender to palpation  Data Reviewed  I have  personally reviewed the patient's imaging and medical records.    Assessment/ Plan We'll obtain an elderly female with symptomatic left leg hematoma in need for drainage. Discussed with the patient and the family in detail about the need for drainage. Risks benefits and possible complications the both agree. Face-to-face time spent with the patient and care providers was 30 minutes, with more than 50% of the time spent counseling, educating, and coordinating care of the patient.    Procedure NOTE Preoperative Diagnosis: Large deep left leg hematoma Postoperative diagnosis: SAME Procedure: I  and D of a deep complex left leg hematoma                      3 layer compression wrapping dressing Findings: Large hematoma approximately 500 cc of old blood and clots were drained  Alexis prep and drape in the standard fashion. And she did have a small puncture wound in the left lateral leg and using a Q-tip I was able to drain this area. There was significant loculations that I was able to lyse using my Q-tip and was able to drain all the clots and all the all the blood from her leg. There was significant relief after just range. A 3 layer compression wrap performing the standard fashion. No complications He will follow-up with Dr. Con Memos with me in a couple weeks.  [place], MD [place] 10/20/2015, 1:39 PM

## 2015-10-20 NOTE — Patient Instructions (Signed)
We have removed all of the blood and blood clots from your leg today.   Please follow-up in our office in 2 weeks as scheduled below, you will also need to follow-up with Dr. Con Memos in 1 week to change out the compression wrap on your leg.  Stop your Aspirin for 7 days and abstain from alcohol for 7 days as well.   Please call with any questions or concerns prior to your scheduled appointment.

## 2015-10-23 ENCOUNTER — Encounter: Payer: Medicare Other | Attending: Surgery | Admitting: Surgery

## 2015-10-23 DIAGNOSIS — M7981 Nontraumatic hematoma of soft tissue: Secondary | ICD-10-CM | POA: Insufficient documentation

## 2015-10-23 DIAGNOSIS — L97222 Non-pressure chronic ulcer of left calf with fat layer exposed: Secondary | ICD-10-CM | POA: Diagnosis not present

## 2015-10-23 DIAGNOSIS — Y29XXXA Contact with blunt object, undetermined intent, initial encounter: Secondary | ICD-10-CM | POA: Diagnosis not present

## 2015-10-23 DIAGNOSIS — H9190 Unspecified hearing loss, unspecified ear: Secondary | ICD-10-CM | POA: Insufficient documentation

## 2015-10-23 DIAGNOSIS — I2510 Atherosclerotic heart disease of native coronary artery without angina pectoris: Secondary | ICD-10-CM | POA: Insufficient documentation

## 2015-10-23 DIAGNOSIS — D649 Anemia, unspecified: Secondary | ICD-10-CM | POA: Diagnosis not present

## 2015-10-23 DIAGNOSIS — I10 Essential (primary) hypertension: Secondary | ICD-10-CM | POA: Diagnosis not present

## 2015-10-24 NOTE — Progress Notes (Signed)
JARELY, JUNCAJ ([id number]) Visit Report for 10/23/2015 Arrival Information Details Patient Name: [name], [name]. Date of Service: 10/23/2015 9:30 AM Medical Record Number: [id number] Patient Account Number: [id number] Date of Birth/Sex: [birth year] (82 y.o. Female) Treating RN: Macarthur Critchley Primary Care Physician: [name] Other Clinician: Referring Physician: [name] Treating Physician/Extender: Frann Rider in Treatment: 2 Visit Information History Since Last Visit All ordered tests and consults were completed: No Patient Arrived: Wheel Chair Added or deleted any medications: No Arrival Time: 09:46 Any new allergies or adverse reactions: No Accompanied By: daughter Had a fall or experienced change in No activities of daily living that may affect Transfer Assistance: None risk of falls: Patient Identification Verified: Yes Signs or symptoms of abuse/neglect since last No Secondary Verification Process Yes visito Completed: Hospitalized since last visit: No Patient Requires Transmission-Based No Has Dressing in Place as Prescribed: Yes Precautions: Has Compression in Place as Prescribed: Yes Patient Has Alerts: No Pain Present Now: No Electronic Signature(s) Signed: 10/23/2015 4:31:21 PM By: Rebecca Eaton, RN, Sendra Entered By: Rebecca Eaton RN, Sendra on 10/23/2015 62:13:08 Mosely, [name] ([id number]) -------------------------------------------------------------------------------- Clinic Level of Care Assessment Details Patient Name: [name], [name] Date of Service: 10/23/2015 9:30 AM Medical Record Number: [id number] Patient Account Number: [id number] Date of Birth/Sex: 17-Nov-1924 (82 y.o. Female) Treating RN: Macarthur Critchley Primary Care Physician: [name] Other Clinician: Referring Physician: [name] Treating Physician/Extender: Frann Rider in Treatment: 2 Clinic Level of Care Assessment Items TOOL 4 Quantity Score X - Use  when only an EandM is performed on FOLLOW-UP visit 1 0 ASSESSMENTS - Nursing Assessment / Reassessment X - Reassessment of Co-morbidities (includes updates in patient status) 1 10 X - Reassessment of Adherence to Treatment Plan 1 5 ASSESSMENTS - Wound and Skin Assessment / Reassessment X - Simple Wound Assessment / Reassessment - one wound 1 5 '[]'$  - Complex Wound Assessment / Reassessment - multiple wounds 0 '[]'$  - Dermatologic / Skin Assessment (not related to wound area) 0 ASSESSMENTS - Focused Assessment X - Circumferential Edema Measurements - multi extremities 1 5 '[]'$  - Nutritional Assessment / Counseling / Intervention 0 X - Lower Extremity Assessment (monofilament, tuning fork, pulses) 1 5 '[]'$  - Peripheral Arterial Disease Assessment (using hand held doppler) 0 ASSESSMENTS - Ostomy and/or Continence Assessment and Care '[]'$  - Incontinence Assessment and Management 0 '[]'$  - Ostomy Care Assessment and Management (repouching, etc.) 0 PROCESS - Coordination of Care X - Simple Patient / Family Education for ongoing care 1 15 '[]'$  - Complex (extensive) Patient / Family Education for ongoing care 0 X - Staff obtains Programmer, systems, Records, Test Results / Process Orders 1 10 '[]'$  - Staff telephones HHA, Nursing Homes / Clarify orders / etc 0 '[]'$  - Routine Transfer to another Facility (non-emergent condition) 0 [name], [name] ([id number]) '[]'$  - Routine Hospital Admission (non-emergent condition) 0 '[]'$  - New Admissions / Biomedical engineer / Ordering NPWT, Apligraf, etc. 0 '[]'$  - Emergency Hospital Admission (emergent condition) 0 X - Simple Discharge Coordination 1 10 '[]'$  - Complex (extensive) Discharge Coordination 0 PROCESS - Special Needs '[]'$  - Pediatric / Minor Patient Management 0 '[]'$  - Isolation Patient Management 0 '[]'$  - Hearing / Language / Visual special needs 0 '[]'$  - Assessment of Community assistance (transportation, D/C planning, etc.) 0 '[]'$  - Additional assistance / Altered mentation 0 '[]'$  -  Support Surface(s) Assessment (bed, cushion, seat, etc.) 0 INTERVENTIONS - Wound Cleansing / Measurement X - Simple Wound Cleansing - one wound 1 5 '[]'$  -  Complex Wound Cleansing - multiple wounds 0 X - Wound Imaging (photographs - any number of wounds) 1 5 '[]'$  - Wound Tracing (instead of photographs) 0 X - Simple Wound Measurement - one wound 1 5 '[]'$  - Complex Wound Measurement - multiple wounds 0 INTERVENTIONS - Wound Dressings '[]'$  - Small Wound Dressing one or multiple wounds 0 '[]'$  - Medium Wound Dressing one or multiple wounds 0 X - Large Wound Dressing one or multiple wounds 1 20 '[]'$  - Application of Medications - topical 0 '[]'$  - Application of Medications - injection 0 INTERVENTIONS - Miscellaneous '[]'$  - External ear exam 0 [name], [name] '[]'$  - Specimen Collection (cultures, biopsies, blood, body fluids, etc.) 0 '[]'$  - Specimen(s) / Culture(s) sent or taken to Lab for analysis 0 '[]'$  - Patient Transfer (multiple staff / Harrel Lemon Lift / Similar devices) 0 '[]'$  - Simple Staple / Suture removal (25 or less) 0 '[]'$  - Complex Staple / Suture removal (26 or more) 0 '[]'$  - Hypo / Hyperglycemic Management (close monitor of Blood Glucose) 0 '[]'$  - Ankle / Brachial Index (ABI) - do not check if billed separately 0 X - Vital Signs 1 5 Has the patient been seen at the hospital within the last three years: Yes Total Score: 105 Level Of Care: New/Established - Level 3 Electronic Signature(s) Signed: 10/23/2015 4:31:21 PM By: Rebecca Eaton, RN, Sendra Entered By: Rebecca Eaton RN, Sendra on 10/23/2015 78:29:56 [name] -------------------------------------------------------------------------------- Encounter Discharge Information Details Patient Name: [name], [name]. Date of Service: 10/23/2015 9:30 AM Medical Record Number: [id number] Patient Account Number: [id number] Date of Birth/Sex: November 04, 1924 (82 y.o. Female) Treating RN: Macarthur Critchley Primary Care Physician: [name] Other Clinician: Referring Physician: [name] Treating Physician/Extender: Frann Rider in Treatment: 2 Encounter Discharge Information Items Discharge Pain Level: 0 Discharge Condition: Stable Ambulatory Status: Wheelchair Discharge Destination: Home Transportation: Private Auto Accompanied By: daughter Schedule Follow-up Appointment: Yes Medication Reconciliation completed and provided to Patient/Care Yes Keashia Haskins: Provided on Clinical Summary of Care: 10/23/2015 Form Type Recipient Paper Patient [facility] Electronic Signature(s) Signed: 10/23/2015 4:31:21 PM By: Rebecca Eaton RN, Sendra Previous Signature: 10/23/2015 10:21:09 AM Version By: Ruthine Dose Entered By: Rebecca Eaton RN, Sendra on 10/23/2015 41:32:44 [name], [name] ([id number]) -------------------------------------------------------------------------------- Lower Extremity Assessment Details Patient Name: [name], [name]. Date of Service: 10/23/2015 9:30 AM Medical Record Number: [id number] Patient Account Number: [id number] Date of Birth/Sex: 03-Jul-1925 (82 y.o. Female) Treating RN: Macarthur Critchley Primary Care Physician: [name] Other Clinician: Referring Physician: [name] Treating Physician/Extender: Frann Rider in Treatment: 2 Edema Assessment Assessed: [Left: No] [Right: No] Edema: [Left: Ye] [Right: s] Calf Left: Right: Point of Measurement: 32 cm From Medial Instep 31 cm cm Ankle Left: Right: Point of Measurement: 12 cm From Medial Instep 20.5 cm cm Vascular Assessment Pulses: Posterior Tibial Dorsalis Pedis Palpable: [Left:Yes] Extremity colors, hair growth, and conditions: Extremity Color: [Left:Mottled] Hair Growth on Extremity: [Left:No] Temperature of Extremity: [Left:Warm] Capillary Refill: [Left:< 3 seconds] Toe Nail Assessment Left: Right: Thick: No Discolored: No Deformed: No Improper Length and Hygiene: No Electronic Signature(s) Signed: 10/23/2015  4:31:21 PM By: Rebecca Eaton, RN, Sendra Entered By: Rebecca Eaton RN, Sendra on 10/23/2015 59:56:38 Brendlinger, [name] ([id number]) -------------------------------------------------------------------------------- Pain Assessment Details Patient Name: [name], [name]. Date of Service: 10/23/2015 9:30 AM Medical Record Number: [id number] Patient Account Number: [id number] Date of Birth/Sex: 1925-06-14 (82 y.o. Female) Treating RN: Macarthur Critchley Primary Care Physician: [name] Other Clinician: Referring Physician: [name] Treating Physician/Extender: Frann Rider in Treatment:  2 Active Problems Location of Pain Severity and Description of Pain Patient Has Paino No Site Locations Rate the pain. Current Pain Level: 0 Pain Management and Medication Current Pain Management: Electronic Signature(s) Signed: 10/23/2015 4:31:21 PM By: Rebecca Eaton, RN, Sendra Entered By: Rebecca Eaton RN, Sendra on 10/23/2015 50:56:97 Ledvina, [name] ([id number]) -------------------------------------------------------------------------------- Patient/Caregiver Education Details Patient Name: [name]. Date of Service: 10/23/2015 9:30 AM Medical Record Number: [id number] Patient Account Number: [id number] Date of Birth/Gender: 04-Jun-1925 (82 y.o. Female) Treating RN: Macarthur Critchley Primary Care Physician: [name] Other Clinician: Referring Physician: [name] Treating Physician/Extender: Frann Rider in Treatment: 2 Education Assessment Education Provided To: Patient and Caregiver daughter Education Topics Provided Wound/Skin Impairment: Handouts: Caring for Your Ulcer, Skin Care Do's and Dont's Methods: Explain/Verbal Responses: State content correctly Electronic Signature(s) Signed: 10/23/2015 4:31:21 PM By: Rebecca Eaton, RN, Sendra Entered By: Rebecca Eaton RN, Sendra on 10/23/2015 75:44:92 [name], [name]  ([id number]) -------------------------------------------------------------------------------- Wound Assessment Details Patient Name: [name], [name]. Date of Service: 10/23/2015 9:30 AM Medical Record Number: [id number] Patient Account Number: [id number] Date of Birth/Sex: [birth year] (82 y.o. Female) Treating RN: Macarthur Critchley Primary Care Physician: [name] Other Clinician: Referring Physician: [name] Treating Physician/Extender: Frann Rider in Treatment: 2 Wound Status Wound Number: 1 Primary Etiology: Trauma, Other Wound Location: Left, Lateral Lower Leg Wound Status: Open Wounding Event: Trauma Date Acquired: 09/08/2015 Weeks Of Treatment: 2 Clustered Wound: No Photos Photo Uploaded By: Rebecca Eaton, RN, Roslynn Amble on 10/23/2015 16:26:02 Wound Measurements Length: (cm) 0.1 Width: (cm) 0.1 Depth: (cm) 1.1 Area: (cm) 0.008 Volume: (cm) 0.009 % Reduction in Area: 99.8% % Reduction in Volume: 97.6% Wound Description Classification: Unclassifiable Periwound Skin Texture Texture Color No Abnormalities Noted: No No Abnormalities Noted: No Moisture No Abnormalities Noted: No Treatment Notes Wound #1 (Left, Lateral Lower Leg) 1. Cleansed with: Demeyer, Helga V. ([id number]) Clean wound with Normal Saline Cleanse wound with antibacterial soap and water 4. Dressing Applied: Dry Gauze 5. Secondary Dressing Applied Kerlix/Conform 7. Secured with 2 Layer Compression System - Left Lower Extremity Electronic Signature(s) Signed: 10/23/2015 4:31:21 PM By: Rebecca Eaton, RN, Sendra Entered By: Rebecca Eaton RN, Sendra on 10/23/2015 94:07:68 [name], [name] ([id number]) -------------------------------------------------------------------------------- Vitals Details Patient Name: [name], [name]. Date of Service: 10/23/2015 9:30 AM Medical Record Number: [id number] Patient Account Number: [id number] Date of Birth/Sex: 05/17/1925 (82 y.o. Female) Treating RN: Macarthur Critchley Primary Care Physician: [name] Other Clinician: Referring Physician: [name] Treating Physician/Extender: Frann Rider in Treatment: 2 Vital Signs Time Taken: 09:47 Temperature (F): 97.8 Pulse (bpm): 78 Blood Pressure (mmHg): 123/64 Reference Range: 80 - 120 mg / dl Electronic Signature(s) Signed: 10/23/2015 4:31:21 PM By: Rebecca Eaton RN, Sendra Entered By: Rebecca Eaton RN, Sendra on 10/23/2015 09:50:46

## 2015-10-24 NOTE — Progress Notes (Signed)
RUSHIE, BRAZEL ([id number]) Visit Report for 10/23/2015 Chief Complaint Document Details Patient Name: [name]. Date of Service: 10/23/2015 9:30 AM Medical Record Number: [id number] Patient Account Number: [id number] Date of Birth/Sex: 10-20-1924 (82 y.o. Female) Treating RN: Macarthur Critchley Primary Care Physician: [name] Other Clinician: Referring Physician: [name] Treating Physician/Extender: Frann Rider in Treatment: 2 Information Obtained from: Patient Chief Complaint Patient presents to the wound care center for a consult due non healing wound to the left lower extremity for about a month Electronic Signature(s) Signed: 10/23/2015 10:50:11 AM By: Christin Fudge MD, FACS Entered By: Christin Fudge on 10/23/2015 69:67:89 [name] ([id number]) -------------------------------------------------------------------------------- HPI Details Patient Name: [name]. Date of Service: 10/23/2015 9:30 AM Medical Record Number: [id number] Patient Account Number: [id number] Date of Birth/Sex: 20-Mar-1925 (82 y.o. Female) Treating RN: Macarthur Critchley Primary Care Physician: [name] Other Clinician: Referring Physician: [name] Treating Physician/Extender: Frann Rider in Treatment: 2 History of Present Illness Location: large swelling and wound of the left lower extremity Quality: Patient reports experiencing a dull pain to affected area(s). Severity: Patient states wound are getting worse. Duration: Patient has had the wound for < 4 weeks prior to presenting for treatment Timing: Pain in wound is constant (hurts all the time) Context: The wound occurred when the patient had a large heavy blunt object fall on her left leg Modifying Factors: Other treatment(s) tried include: has been on antibiotic ointment locally and orally Associated Signs and Symptoms: Patient reports having increase swelling. HPI Description: 82 year old  patient who is known to have a nonhealing wound on the left lower leg has had this for about a month. She has been seen by her PCP and was on doxycycline earlier and now is on Augmentin. She is swelling of the leg and scab over this and the family requested a referral to the wound center. Her past medical history significant for arthritis, anemia, cardiovascular disease, deafness, hypertension, colon polyps and throat cancer. 10/09/2015 -- Ultrasound of the left lower extremity done which showed IMPRESSION: Complex collection within the lateral left lower calf measuring at least 3 x 2.8 x 7 cm. This may represent an evolving hematoma, but correlate clinically as infection/ abscess is not excluded. Radiology had spoken to me about this patient after the ultrasound and I had requested a radiologist to see her for a aspiration of the hematoma under ultrasound guidance. this appointment is still pending and we are trying to facilitate this for the patient and her daughter. 10/17/2015 -- had a ultrasound-guided aspiration on 10/09/2015, IMPRESSION: Successful aspiration yielding approximately 100 mL dark maroon blood. She is rapidly collected fluid back in the leg in spite of the aspiration. The wound is not open. 10/23/2015 -- she was seen by Dr. Caroleen Hamman in the office on 10/20/2015 and a office procedure with IandD of the left leg hematoma was done approximately 500 mL of old blood and clot was drained. no JP drain was left in situ so the patient had a 3 layer compression wrap applied over this. Electronic Signature(s) Signed: 10/23/2015 10:53:47 AM By: Christin Fudge MD, FACS Entered By: Christin Fudge on 10/23/2015 42:35:36 [name] ([id number]) -------------------------------------------------------------------------------- Physical Exam Details Patient Name: [name]. Date of Service: 10/23/2015 9:30 AM Medical Record Number: [id number] Patient Account Number: [id number] Date  of Birth/Sex: 06/01/1925 (82 y.o. Female) Treating RN: Macarthur Critchley Primary Care Physician: [name] Other Clinician: Referring Physician: [name] Treating Physician/Extender: Frann Rider in Treatment: 2  Constitutional . Pulse regular. Respirations normal and unlabored. Afebrile. . Eyes Nonicteric. Reactive to light. Ears, Nose, Mouth, and Throat Lips, teeth, and gums WNL.Marland Kitchen Moist mucosa without lesions. Neck supple and nontender. No palpable supraclavicular or cervical adenopathy. Normal sized without goiter. Respiratory WNL. No retractions.. Cardiovascular Pedal Pulses WNL. No clubbing, cyanosis or edema. Lymphatic No adneopathy. No adenopathy. No adenopathy. Musculoskeletal Adexa without tenderness or enlargement.. Digits and nails w/o clubbing, cyanosis, infection, petechiae, ischemia, or inflammatory conditions.. Integumentary (Hair, Skin) No suspicious lesions. No crepitus or fluctuance. No peri-wound warmth or erythema. No masses.Marland Kitchen Psychiatric Judgement and insight Intact.. No evidence of depression, anxiety, or agitation.. Notes the left lower extremity hematoma was drained by surgery and about 500 mL of fluid was removed. There is an open wound which after gentle probing a large amount of serosanguineous sanguinous fluid came out and this may be about 50 mL. There does not seem to be any signs of inflammation or infection. Electronic Signature(s) Signed: 10/23/2015 10:54:29 AM By: Christin Fudge MD, FACS Entered By: Christin Fudge on 10/23/2015 81:19:14 [name] ([id number]) -------------------------------------------------------------------------------- Physician Orders Details Patient Name: [name]. Date of Service: 10/23/2015 9:30 AM Medical Record Number: [id number] Patient Account Number: [id number] Date of Birth/Sex: [birth year] (82 y.o. Female) Treating RN: Macarthur Critchley Primary Care Physician: [name] Other  Clinician: Referring Physician: [name] Treating Physician/Extender: Frann Rider in Treatment: 2 Verbal / Phone Orders: Yes Clinician: Macarthur Critchley Read Back and Verified: Yes Diagnosis Coding Wound Cleansing Wound #1 Left,Lateral Lower Leg o May Shower, gently pat wound dry prior to applying new dressing. Primary Wound Dressing Wound #1 Left,Lateral Lower Leg o Dry Gauze Secondary Dressing Wound #1 Left,Lateral Lower Leg o Conform/Kerlix Dressing Change Frequency Wound #1 Left,Lateral Lower Leg o Change dressing every other day. Follow-up Appointments Wound #1 Left,Lateral Lower Leg o Return Appointment in 1 week. Edema Control Wound #1 Left,Lateral Lower Leg o 2 Layer Compression System - Left Lower Extremity Electronic Signature(s) Signed: 10/23/2015 4:17:59 PM By: Christin Fudge MD, FACS Signed: 10/23/2015 4:31:21 PM By: Rebecca Eaton RN, Sendra Entered By: Rebecca Eaton RN, Sendra on 10/23/2015 62:95:28 [place], [name] ([id number]) -------------------------------------------------------------------------------- Problem List Details Patient Name: Langworthy, Leba V. Date of Service: 10/23/2015 9:30 AM Medical Record Number: [id number] Patient Account Number: [id number] Date of Birth/Sex: May 27, 1925 (82 y.o. 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 Date of Service: 10/23/2015 9:30 AM Medical Record Number: [id number] Patient Account Number: [id number] Date of Birth/Sex: April 25, 1925 (82 y.o. Female) Treating RN: Macarthur Critchley Primary Care Physician: [name] Other Clinician: Referring Physician: [name] Treating Physician/Extender: Frann Rider in Treatment: 2 Subjective Chief Complaint Information obtained from Patient Patient presents to the wound care center for a consult due non healing wound to the left lower extremity for about a month History of Present Illness (HPI) The following HPI elements were documented for the patient's wound: Location: large swelling and wound of the left lower extremity Quality: Patient  reports experiencing a dull pain to affected area(s). Severity: Patient states wound are getting worse. Duration: Patient has had the wound for < 4 weeks prior to presenting for treatment Timing: Pain in wound is constant (hurts all the time) Context: The wound occurred when the patient had a large heavy blunt object fall on her left leg Modifying Factors: Other treatment(s) tried include: has been on antibiotic ointment locally and orally Associated Signs and Symptoms: Patient reports having increase swelling. 82 year old patient who is known to have a nonhealing wound on the left lower leg has had this for about a month. She has been seen by her PCP and was on doxycycline earlier and now is on Augmentin. She is swelling of the leg and scab over this and the family requested a referral to the wound center. Her past medical history significant for arthritis, anemia, cardiovascular disease, deafness, hypertension, colon polyps and throat cancer. 10/09/2015 -- Ultrasound of the left lower extremity done which  showed IMPRESSION: Complex collection within the lateral left lower calf measuring at least 3 x 2.8 x 7 cm. This may represent an evolving hematoma, but correlate clinically as infection/ abscess is not excluded. Radiology had spoken to me about this patient after the ultrasound and I had requested a radiologist to see her for a aspiration of the hematoma under ultrasound guidance. this appointment is still pending and we are trying to facilitate this for the patient and her daughter. 10/17/2015 -- had a ultrasound-guided aspiration on 10/09/2015, IMPRESSION: Successful aspiration yielding approximately 100 mL dark maroon blood. She is rapidly collected fluid back in the leg in spite of the aspiration. The wound is not open. 10/23/2015 -- she was seen by Dr. Caroleen Hamman in the office on 10/20/2015 and a office procedure with IandD of the left leg hematoma was done approximately 500 mL of old blood and clot was drained. no JP drain was left in situ so the patient had a 3 layer compression wrap applied over this. Sakai, [place] ([id number]) Objective Constitutional Pulse regular. Respirations normal and unlabored. Afebrile. Vitals Time Taken: 9:47 AM, Temperature: 97.8 F, Pulse: 78 bpm, Blood Pressure: 123/64 mmHg. Eyes Nonicteric. Reactive to light. Ears, Nose, Mouth, and Throat Lips, teeth, and gums WNL.Marland Kitchen Moist mucosa without lesions. Neck supple and nontender. No palpable supraclavicular or cervical adenopathy. Normal sized without goiter. Respiratory WNL. No retractions.. Cardiovascular Pedal Pulses WNL. No clubbing, cyanosis or edema. Lymphatic No adneopathy. No adenopathy. No adenopathy. Musculoskeletal Adexa without tenderness or enlargement.. Digits and nails w/o clubbing, cyanosis, infection, petechiae, ischemia, or inflammatory conditions.Marland Kitchen Psychiatric Judgement and insight Intact.. No evidence of depression, anxiety, or agitation.. General Notes: the left lower  extremity hematoma was drained by surgery and about 500 mL of fluid was removed. There is an open wound which after gentle probing a large amount of serosanguineous sanguinous fluid came out and this may be about 50 mL. There does not seem to be any signs of inflammation or infection. Integumentary (Hair, Skin) No suspicious lesions. No crepitus or fluctuance. No peri-wound warmth or erythema. No masses.. Wound #1 status is Open. Original cause of wound was Trauma. The wound is located on the Left,Lateral Lower Leg. The wound measures 0.1cm length x 0.1cm width x 1.1cm depth; 0.008cm^2 area and Tunison, Orilla V. ([id number]) 1.696VE^9 volume. Assessment Active Problems ICD-10 M79.81 - Nontraumatic hematoma of soft tissue L97.222 - Non-pressure chronic ulcer of left calf with fat layer exposed Y29.XXXA - Contact with blunt object, undetermined intent, initial encounter  Since the hematoma has been evacuated and there is no drain in place I have recommended a 2 layer compression wrap with appropriate padding to and to be changed every other day by her relative who is a Marine scientist or a Presenter, broadcasting. All questions answered and she will follow-up with me next week and also see her surgeon. Plan Wound Cleansing: Wound #1 Left,Lateral Lower Leg: May Shower, gently pat wound dry prior to applying new dressing. Primary Wound Dressing: Wound #1 Left,Lateral Lower Leg: Dry Gauze Secondary Dressing: Wound #1 Left,Lateral Lower Leg: Conform/Kerlix Dressing Change Frequency: Wound #1 Left,Lateral Lower Leg: Change dressing every other day. Follow-up Appointments: Wound #1 Left,Lateral Lower Leg: Return Appointment in 1 week. Edema Control: Wound #1 Left,Lateral Lower Leg: 2 Layer Compression System - Left Lower Extremity Anacker, Teandra V. ([id number]) Since the hematoma has been evacuated and there is no drain in place I have recommended a 2 layer compression wrap with appropriate padding to  and to be changed every other day by her relative who is a Marine scientist or a Presenter, broadcasting. All questions answered and she will follow-up with me next week and also see her surgeon. Electronic Signature(s) Signed: 10/23/2015 10:55:25 AM By: Christin Fudge MD, FACS Entered By: Christin Fudge on 10/23/2015 92:95:74 Silberstein, [name] ([id number]) -------------------------------------------------------------------------------- SuperBill Details Patient Name: Duskin, [name]. Date of Service: 10/23/2015 Medical Record Number: [id number] Patient Account Number: [id number] Date of Birth/Sex: July 09, 1925 (82 y.o. Female) Treating RN: Macarthur Critchley Primary Care Physician: [name] Other Clinician: Referring Physician: [name] Treating Physician/Extender: Frann Rider in Treatment: 2 Diagnosis Coding ICD-10 Codes Code Description M79.81 Nontraumatic hematoma of soft tissue L97.222 Non-pressure chronic ulcer of left calf with fat layer exposed Y29.Merril Abbe Contact with blunt object, undetermined intent, initial encounter Facility Procedures CPT4 Code: [id number] Description: [phone] - WOUND CARE VISIT-LEV 3 EST PT Modifier: Quantity: 1 Physician Procedures CPT4 Code Description: [id number] - WC PHYS LEVEL 3 - EST PT ICD-10 Description Diagnosis M79.81 Nontraumatic hematoma of soft tissue L97.222 Non-pressure chronic ulcer of left calf with fat lay Y29.XXXA Contact with blunt object, undetermined intent,  init Modifier: er exposed ial encounter Quantity: 1 Electronic Signature(s) Signed: 10/23/2015 4:17:59 PM By: Christin Fudge MD, FACS Signed: 10/23/2015 4:31:21 PM By: Rebecca Eaton RN, Sendra Previous Signature: 10/23/2015 10:55:34 AM Version By: Christin Fudge MD, FACS Entered By: Rebecca Eaton RN, Sendra on 10/23/2015 11:07:43

## 2015-10-30 ENCOUNTER — Encounter: Payer: Medicare Other | Admitting: Surgery

## 2015-10-30 DIAGNOSIS — L97222 Non-pressure chronic ulcer of left calf with fat layer exposed: Secondary | ICD-10-CM | POA: Diagnosis not present

## 2015-10-31 NOTE — Progress Notes (Signed)
[name] ([id number]) Visit Report for 10/30/2015 Arrival Information Details Patient Name: [name], [name]. Date of Service: 10/30/2015 8:45 AM Medical Record Number: [id number] Patient Account Number: [id number] Date of Birth/Sex: 07-28-1925 (82 y.o. Female) Treating RN: Carolyne Fiscal, Debi Primary Care Physician: [name] Other Clinician: Referring Physician: [name] Treating Physician/Extender: Frann Rider in Treatment: 3 Visit Information History Since Last Visit All ordered tests and consults were completed: No Patient Arrived: Wheel Chair Added or deleted any medications: No Arrival Time: 08:52 Any new allergies or adverse reactions: No Accompanied By: daughter Had a fall or experienced change in No activities of daily living that may affect Transfer Assistance: None risk of falls: Patient Identification Verified: Yes Signs or symptoms of abuse/neglect since last No Secondary Verification Process Yes visito Completed: Hospitalized since last visit: No Patient Requires Transmission-Based No Pain Present Now: No Precautions: Patient Has Alerts: No Electronic Signature(s) Signed: 10/30/2015 5:59:16 PM By: Alric Quan Entered By: Alric Quan on 10/30/2015 43:15:40 [name], [name] ([id number]) -------------------------------------------------------------------------------- Encounter Discharge Information Details Patient Name: [name], [name]. Date of Service: 10/30/2015 8:45 AM Medical Record Number: [id number] Patient Account Number: [id number] Date of Birth/Sex: 1925-05-24 (82 y.o. Female) Treating RN: Carolyne Fiscal, Debi Primary Care Physician: [name] Other Clinician: Referring Physician: [name] Treating Physician/Extender: Frann Rider in Treatment: 3 Encounter Discharge Information Items Discharge Pain Level: 0 Discharge Condition: Stable Ambulatory Status: Wheelchair Discharge Destination:  Home Transportation: Private Auto Accompanied By: daughter Schedule Follow-up Appointment: Yes Medication Reconciliation completed and provided to Patient/Care Yes Aisia Correira: Provided on Clinical Summary of Care: 10/30/2015 Form Type Recipient Paper Patient [facility] Electronic Signature(s) Signed: 10/30/2015 9:24:32 AM By: Ruthine Dose Entered By: Ruthine Dose on 10/30/2015 80:99:83 [name], [name] ([id number]) -------------------------------------------------------------------------------- Lower Extremity Assessment Details Patient Name: Wollenberg, [name]. Date of Service: 10/30/2015 8:45 AM Medical Record Number: [id number] Patient Account Number: [id number] Date of Birth/Sex: September 08, 1925 (82 y.o. Female) Treating RN: Carolyne Fiscal, Debi Primary Care Physician: [name] Other Clinician: Referring Physician: [name] Treating Physician/Extender: Frann Rider in Treatment: 3 Edema Assessment Assessed: Shirlyn Goltz: No] [Right: No] E[Left: dema] [Right: :] Calf Left: Right: Point of Measurement: cm From Medial Instep 32.4 cm cm Ankle Left: Right: Point of Measurement: cm From Medial Instep 22 cm cm Vascular Assessment Pulses: Posterior Tibial Dorsalis Pedis Palpable: [Left:Yes] Extremity colors, hair growth, and conditions: Extremity Color: [Left:Mottled] Temperature of Extremity: [Left:Warm] Capillary Refill: [Left:< 3 seconds] Toe Nail Assessment Left: Right: Thick: No Discolored: No Deformed: No Improper Length and Hygiene: No Electronic Signature(s) Signed: 10/30/2015 5:59:16 PM By: Alric Quan Entered By: Alric Quan on 10/30/2015 02:40:97 [name], [name] ([id number]) -------------------------------------------------------------------------------- Multi Wound Chart Details Patient Name: [name], [name] Date of Service: 10/30/2015 8:45 AM Medical Record Number: [id number] Patient Account Number: [id number] Date of Birth/Sex: February 18, 1925 (82 y.o.  Female) Treating RN: Carolyne Fiscal, Debi Primary Care Physician: [name] Other Clinician: Referring Physician: [name] Treating Physician/Extender: Frann Rider in Treatment: 3 Vital Signs Height(in): Pulse(bpm): 71 Weight(lbs): Blood Pressure 101/79 (mmHg): Body Mass Index(BMI): Temperature(F): 98.0 Respiratory Rate 18 (breaths/min): Photos: [1:No Photos] [N/A:N/A] Wound Location: [1:Left Lower Leg - Lateral] [N/A:N/A] Wounding Event: [1:Trauma] [N/A:N/A] Primary Etiology: [1:Trauma, Other] [N/A:N/A] Comorbid History: [1:Cataracts, Anemia, Coronary Artery Disease, Hypertension, Osteoarthritis] [N/A:N/A] Date Acquired: [1:09/08/2015] [N/A:N/A] Weeks of Treatment: [1:3] [N/A:N/A] Wound Status: [1:Open] [N/A:N/A] Measurements L x W x D 2.6x0.5x0.2 [N/A:N/A] (cm) Area (cm) : [1:1.021] [N/A:N/A] Volume (cm) : [1:0.204] [N/A:N/A] % Reduction in Area: [1:73.30%] [N/A:N/A] % Reduction in Volume: 46.60% [  N/A:N/A] Classification: [1:Unclassifiable] [N/A:N/A] Exudate Amount: [1:Small] [N/A:N/A] Exudate Type: [1:Serosanguineous] [N/A:N/A] Exudate Color: [1:red, brown] [N/A:N/A] Granulation Amount: [1:None Present (0%)] [N/A:N/A] Necrotic Amount: [1:Large (67-100%)] [N/A:N/A] Necrotic Tissue: [1:Eschar] [N/A:N/A] Exposed Structures: [1:Fascia: No Fat: No Tendon: No Muscle: No Joint: No Bone: No] [N/A:N/A] Limited to Skin Breakdown Epithelialization: Small (1-33%) N/A N/A Periwound Skin Texture: Edema: Yes N/A N/A Periwound Skin No Abnormalities Noted N/A N/A Moisture: Periwound Skin Color: No Abnormalities Noted N/A N/A Temperature: No Abnormality N/A N/A Tenderness on Yes N/A N/A Palpation: Wound Preparation: Ulcer Cleansing: N/A N/A Rinsed/Irrigated with Saline Topical Anesthetic Applied: Other: lidocaine 4% Treatment Notes Electronic Signature(s) Signed: 10/30/2015 5:59:16 PM By: Alric Quan Entered By: Alric Quan on 10/30/2015  73:71:06 [place], [name] ([id number]) -------------------------------------------------------------------------------- Multi-Disciplinary Care Plan Details Patient Name: [name], [name]. Date of Service: 10/30/2015 8:45 AM Medical Record Number: [id number] Patient Account Number: [id number] Date of Birth/Sex: 1925/08/12 (82 y.o. Female) Treating RN: Carolyne Fiscal, Debi Primary Care Physician: [name] Other Clinician: Referring Physician: [name] Treating Physician/Extender: Frann Rider in Treatment: 3 Active Inactive Abuse / Safety / Falls / Self Care Management Nursing Diagnoses: Impaired home maintenance Impaired physical mobility Potential for falls Self care deficit: actual or potential Goals: Patient will remain injury free Date Initiated: 10/03/2015 Goal Status: Active Patient/caregiver will verbalize understanding of skin care regimen Date Initiated: 10/03/2015 Goal Status: Active Patient/caregiver will verbalize/demonstrate measure taken to improve self care Date Initiated: 10/03/2015 Goal Status: Active Patient/caregiver will verbalize/demonstrate measures taken to improve the patient's personal safety Date Initiated: 10/03/2015 Goal Status: Active Patient/caregiver will verbalize/demonstrate measures taken to prevent injury and/or falls Date Initiated: 10/03/2015 Goal Status: Active Patient/caregiver will verbalize/demonstrate understanding of what to do in case of emergency Date Initiated: 10/03/2015 Goal Status: Active Interventions: Assess fall risk on admission and as needed Assess: immobility, friction, shearing, incontinence upon admission and as needed Assess impairment of mobility on admission and as needed per policy Assess self care needs on admission and as needed Patient referred to community resources (specify in notes) Provide education on basic hygiene Cundari, Tommy V. ([id number]) Provide education on fall prevention Provide  education on personal and home safety Provide education on safe transfers Treatment Activities: Education provided on Basic Hygiene : 10/03/2015 Notes: Orientation to the Wound Care Program Nursing Diagnoses: Knowledge deficit related to the wound healing center program Goals: Patient/caregiver will verbalize understanding of the [place] Program Date Initiated: 10/03/2015 Goal Status: Active Interventions: Provide education on orientation to the wound center Notes: Wound/Skin Impairment Nursing Diagnoses: Impaired tissue integrity Knowledge deficit related to smoking impact on wound healing Knowledge deficit related to ulceration/compromised skin integrity Goals: Patient/caregiver will verbalize understanding of skin care regimen Date Initiated: 10/03/2015 Goal Status: Active Ulcer/skin breakdown will have a volume reduction of 30% by week 4 Date Initiated: 10/03/2015 Goal Status: Active Ulcer/skin breakdown will have a volume reduction of 50% by week 8 Date Initiated: 10/03/2015 Goal Status: Active Ulcer/skin breakdown will have a volume reduction of 80% by week 12 Date Initiated: 10/03/2015 Goal Status: Active Ulcer/skin breakdown will heal within 14 weeks Date Initiated: 9/67/8938 [name], [name] ([id number]) Goal Status: Active Interventions: Assess patient/caregiver ability to obtain necessary supplies Assess patient/caregiver ability to perform ulcer/skin care regimen upon admission and as needed Assess ulceration(s) every visit Provide education on ulcer and skin care Notes: Electronic Signature(s) Signed: 10/30/2015 5:59:16 PM By: Alric Quan Entered By: Alric Quan on 10/30/2015 85:27:78 [name], [name] ([id number]) -------------------------------------------------------------------------------- Pain Assessment Details Patient Name: [name] Murphy  V. Date of Service: 10/30/2015 8:45 AM Medical Record Number: [id number] Patient Account  Number: [id number] Date of Birth/Sex: 11/01/1924 (82 y.o. Female) Treating RN: Carolyne Fiscal, Debi Primary Care Physician: [name] Other Clinician: Referring Physician: [name] Treating Physician/Extender: Frann Rider in Treatment: 3 Active Problems Location of Pain Severity and Description of Pain Patient Has Paino No Site Locations Pain Management and Medication Current Pain Management: Electronic Signature(s) Signed: 10/30/2015 5:59:16 PM By: Alric Quan Entered By: Alric Quan on 10/30/2015 86:76:19 [name], [name] ([id number]) -------------------------------------------------------------------------------- Patient/Caregiver Education Details Patient Name: [name]. Date of Service: 10/30/2015 8:45 AM Medical Record Number: [id number] Patient Account Number: [id number] Date of Birth/Gender: [birth year] (82 y.o. Female) Treating RN: Carolyne Fiscal, Debi Primary Care Physician: [name] Other Clinician: Referring Physician: [name] Treating Physician/Extender: Frann Rider in Treatment: 3 Education Assessment Education Provided To: Patient and Caregiver Education Topics Provided Wound/Skin Impairment: Handouts: Other: do not get wrap wet Methods: Demonstration, Explain/Verbal Responses: State content correctly Electronic Signature(s) Signed: 10/30/2015 5:59:16 PM By: Alric Quan Entered By: Alric Quan on 10/30/2015 82:50:53 [name], [name] ([id number]) -------------------------------------------------------------------------------- Wound Assessment Details Patient Name: Hayworth, [name]. Date of Service: 10/30/2015 8:45 AM Medical Record Number: [id number] Patient Account Number: [id number] Date of Birth/Sex: 1924-11-22 (82 y.o. Female) Treating RN: Carolyne Fiscal, Debi Primary Care Physician: [name] Other Clinician: Referring Physician: [name] Treating Physician/Extender: Frann Rider in  Treatment: 3 Wound Status Wound Number: 1 Primary Trauma, Other Etiology: Wound Location: Left Lower Leg - Lateral Wound Open Wounding Event: Trauma Status: Date Acquired: 09/08/2015 Comorbid Cataracts, Anemia, Coronary Artery Weeks Of Treatment: 3 History: Disease, Hypertension, Osteoarthritis Clustered Wound: No Wound Measurements Length: (cm) 2.6 Width: (cm) 0.5 Depth: (cm) 0.2 Area: (cm) 1.021 Volume: (cm) 0.204 % Reduction in Area: 73.3% % Reduction in Volume: 46.6% Epithelialization: Small (1-33%) Tunneling: No Undermining: No Wound Description Classification: Unclassifiable Exudate Amount: Small Exudate Type: Serosanguineous Exudate Color: red, brown Foul Odor After Cleansing: No Wound Bed Granulation Amount: None Present (0%) Exposed Structure Necrotic Amount: Large (67-100%) Fascia Exposed: No Necrotic Quality: Eschar Fat Layer Exposed: No Tendon Exposed: No Muscle Exposed: No Joint Exposed: No Bone Exposed: No Limited to Skin Breakdown Periwound Skin Texture Texture Color No Abnormalities Noted: No No Abnormalities Noted: No Localized Edema: Yes Temperature / Pain Moisture Temperature: No Abnormality No Abnormalities Noted: No Tenderness on Palpation: Yes Wound Preparation Stanis, Jenavie V. ([id number]) Ulcer Cleansing: Rinsed/Irrigated with Saline Topical Anesthetic Applied: Other: lidocaine 4%, Treatment Notes Wound #1 (Left, Lateral Lower Leg) 1. Cleansed with: Clean wound with Normal Saline 2. Anesthetic Topical Lidocaine 4% cream to wound bed prior to debridement 4. Dressing Applied: Dry Gauze 5. Secondary Dressing Applied ABD Pad 7. Secured with Tape 2 Layer Lite Compression System - Left Lower Extremity Electronic Signature(s) Signed: 10/30/2015 5:59:16 PM By: Alric Quan Entered By: Alric Quan on 10/30/2015 83:41:96 Pereira, [name]  ([id number]) -------------------------------------------------------------------------------- [place] Details Patient Name: Tejera, [name]. Date of Service: 10/30/2015 8:45 AM Medical Record Number: [id number] Patient Account Number: [id number] Date of Birth/Sex: 12-24-1924 (82 y.o. Female) Treating RN: Carolyne Fiscal, Debi Primary Care Physician: [name] Other Clinician: Referring Physician: [name] Treating Physician/Extender: Frann Rider in Treatment: 3 Vital Signs Time Taken: 08:52 Temperature (F): 98.0 Pulse (bpm): 71 Respiratory Rate (breaths/min): 18 Blood Pressure (mmHg): 101/79 Reference Range: 80 - 120 mg / dl Electronic Signature(s) Signed: 10/30/2015 5:59:16 PM By: Alric Quan Entered By: Alric Quan on 10/30/2015 08:57:31

## 2015-10-31 NOTE — Progress Notes (Signed)
HOA, BRIGGS ([id number]) Visit Report for 10/30/2015 Chief Complaint Document Details Patient Name: [name], ROSEZELLA [name]. Date of Service: 10/30/2015 8:45 AM Medical Record Number: [id number] Patient Account Number: [id number] Date of Birth/Sex: [birth year] (82 y.o. Female) Treating RN: Carolyne Fiscal, Debi Primary Care Physician: [name] Other Clinician: Referring Physician: [name] Treating Physician/Extender: Frann Rider in Treatment: 3 Information Obtained from: Patient Chief Complaint Patient presents to the wound care center for a consult due non healing wound to the left lower extremity for about a month Electronic Signature(s) Signed: 10/30/2015 9:32:39 AM By: Christin Fudge MD, FACS Entered By: Christin Fudge on 10/30/2015 17:49:44 Casaus, [name] ([id number]) -------------------------------------------------------------------------------- HPI Details Patient Name: [name]. Date of Service: 10/30/2015 8:45 AM Medical Record Number: [id number] Patient Account Number: [id number] Date of Birth/Sex: August 17, 1925 (82 y.o. Female) Treating RN: Carolyne Fiscal, Debi Primary Care Physician: [name] Other Clinician: Referring Physician: [name] Treating Physician/Extender: Frann Rider in Treatment: 3 History of Present Illness Location: large swelling and wound of the left lower extremity Quality: Patient reports experiencing a dull pain to affected area(s). Severity: Patient states wound are getting worse. Duration: Patient has had the wound for < 4 weeks prior to presenting for treatment Timing: Pain in wound is constant (hurts all the time) Context: The wound occurred when the patient had a large heavy blunt object fall on her left leg Modifying Factors: Other treatment(s) tried include: has been on antibiotic ointment locally and orally Associated Signs and Symptoms: Patient reports having increase swelling. HPI Description: 82 year old  patient who is known to have a nonhealing wound on the left lower leg has had this for about a month. She has been seen by her PCP and was on doxycycline earlier and now is on Augmentin. She is swelling of the leg and scab over this and the family requested a referral to the wound center. Her past medical history significant for arthritis, anemia, cardiovascular disease, deafness, hypertension, colon polyps and throat cancer. 10/09/2015 -- Ultrasound of the left lower extremity done which showed IMPRESSION: Complex collection within the lateral left lower calf measuring at least 3 x 2.8 x 7 cm. This may represent an evolving hematoma, but correlate clinically as infection/ abscess is not excluded. Radiology had spoken to me about this patient after the ultrasound and I had requested a radiologist to see her for a aspiration of the hematoma under ultrasound guidance. this appointment is still pending and we are trying to facilitate this for the patient and her daughter. 10/17/2015 -- had a ultrasound-guided aspiration on 10/09/2015, IMPRESSION: Successful aspiration yielding approximately 100 mL dark maroon blood. She is rapidly collected fluid back in the leg in spite of the aspiration. The wound is not open. 10/23/2015 -- she was seen by Dr. Caroleen Hamman in the office on 10/20/2015 and a office procedure with IandD of the left leg hematoma was done approximately 500 mL of old blood and clot was drained. no JP drain was left in situ so the patient had a 3 layer compression wrap applied over this. Electronic Signature(s) Signed: 10/30/2015 9:32:51 AM By: Christin Fudge MD, FACS Entered By: Christin Fudge on 10/30/2015 01:77:93 Piloto, [name] ([id number]) -------------------------------------------------------------------------------- Physical Exam Details Patient Name: [name], [name]. Date of Service: 10/30/2015 8:45 AM Medical Record Number: [id number] Patient Account Number: [id number] Date  of Birth/Sex: 1925/06/12 (82 y.o. Female) Treating RN: Carolyne Fiscal, Debi Primary Care Physician: [name] Other Clinician: Referring Physician: [name] Treating Physician/Extender: Frann Rider in Treatment: 3  Constitutional . Pulse regular. Respirations normal and unlabored. Afebrile. . Eyes Nonicteric. Reactive to light. Ears, Nose, Mouth, and Throat Lips, teeth, and gums WNL.Marland Kitchen Moist mucosa without lesions. Neck supple and nontender. No palpable supraclavicular or cervical adenopathy. Normal sized without goiter. Respiratory WNL. No retractions.. Cardiovascular Pedal Pulses WNL. No clubbing, cyanosis or edema. Lymphatic No adneopathy. No adenopathy. No adenopathy. Musculoskeletal Adexa without tenderness or enlargement.. Digits and nails w/o clubbing, cyanosis, infection, petechiae, ischemia, or inflammatory conditions.. Integumentary (Hair, Skin) No suspicious lesions. No crepitus or fluctuance. No peri-wound warmth or erythema. No masses.Marland Kitchen Psychiatric Judgement and insight Intact.. No evidence of depression, anxiety, or agitation.. Notes the opening in the left lateral compartment has closed down again and she has a recurrent seroma. There is no open wound at the present time. Electronic Signature(s) Signed: 10/30/2015 9:33:34 AM By: Christin Fudge MD, FACS Entered By: Christin Fudge on 10/30/2015 16:10:96 Errickson, [name] ([id number]) -------------------------------------------------------------------------------- Physician Orders Details Patient Name: [name], [name]. Date of Service: 10/30/2015 8:45 AM Medical Record Number: [id number] Patient Account Number: [id number] Date of Birth/Sex: [birth year] (82 y.o. Female) Treating RN: Carolyne Fiscal, Debi Primary Care Physician: [name] Other Clinician: Referring Physician: [name] Treating Physician/Extender: Frann Rider in Treatment: 3 Verbal / Phone Orders: Yes Clinician: Carolyne Fiscal,  Debi Read Back and Verified: Yes Diagnosis Coding Wound Cleansing Wound #1 Left,Lateral Lower Leg o Clean wound with Normal Saline. Anesthetic Wound #1 Left,Lateral Lower Leg o Topical Lidocaine 4% cream applied to wound bed prior to debridement Primary Wound Dressing Wound #1 Left,Lateral Lower Leg o Dry Gauze Secondary Dressing Wound #1 Left,Lateral Lower Leg o ABD pad Dressing Change Frequency Wound #1 Left,Lateral Lower Leg o Other: - pt see surgeon at [facility] on 11/01/15 Follow-up Appointments Wound #1 Left,Lateral Lower Leg o Return Appointment in 1 week. Edema Control Wound #1 Left,Lateral Lower Leg o 2 Layer Lite Compression System - Left Lower Extremity o Elevate legs to the level of the heart and pump ankles as often as possible Electronic Signature(s) Signed: 10/30/2015 4:26:55 PM By: Christin Fudge MD, FACS Signed: 10/30/2015 5:59:16 PM By: Alric Quan Entered By: Alric Quan on 10/30/2015 21:30:86 Cherubini, Aryam V. ([id number]) Congers, Clio. ([id number]) -------------------------------------------------------------------------------- Problem List Details Patient Name: Wyman, Emmeline V. Date of Service: 10/30/2015 8:45 AM Medical Record Number: [id number] Patient Account Number: [id number] Date of Birth/Sex: 1925/05/30 (82 y.o. Female) Treating RN: Carolyne Fiscal, Debi Primary Care Physician: [name] Other Clinician: Referring Physician: [name] Treating Physician/Extender: Frann Rider in Treatment: 3 Active Problems ICD-10 Encounter Code Description Active Date Diagnosis M79.81 Nontraumatic hematoma of soft tissue 10/03/2015 Yes L97.222 Non-pressure chronic ulcer of left calf with fat layer 10/03/2015 Yes exposed Y29.XXXA Contact with blunt object, undetermined intent, initial 10/03/2015 Yes encounter Inactive Problems Resolved Problems Electronic Signature(s) Signed: 10/30/2015 9:32:26 AM By: Christin Fudge  MD, FACS Entered By: Christin Fudge on 10/30/2015 64:40:34 Crate, [name] ([id number]) -------------------------------------------------------------------------------- Progress Note Details Patient Name: Opie, [name]. Date of Service: 10/30/2015 8:45 AM Medical Record Number: [id number] Patient Account Number: [id number] Date of Birth/Sex: 13-Oct-1924 (82 y.o. Female) Treating RN: Carolyne Fiscal, Debi Primary Care Physician: [name] Other Clinician: Referring Physician: [name] Treating Physician/Extender: Frann Rider in Treatment: 3 Subjective Chief Complaint Information obtained from Patient Patient presents to the wound care center for a consult due non healing wound to the left lower extremity for about a month History of Present Illness (HPI) The following HPI elements were documented for the patient's wound: Location: large swelling  and wound of the left lower extremity Quality: Patient reports experiencing a dull pain to affected area(s). Severity: Patient states wound are getting worse. Duration: Patient has had the wound for < 4 weeks prior to presenting for treatment Timing: Pain in wound is constant (hurts all the time) Context: The wound occurred when the patient had a large heavy blunt object fall on her left leg Modifying Factors: Other treatment(s) tried include: has been on antibiotic ointment locally and orally Associated Signs and Symptoms: Patient reports having increase swelling. 82 year old patient who is known to have a nonhealing wound on the left lower leg has had this for about a month. She has been seen by her PCP and was on doxycycline earlier and now is on Augmentin. She is swelling of the leg and scab over this and the family requested a referral to the wound center. Her past medical history significant for arthritis, anemia, cardiovascular disease, deafness, hypertension, colon polyps and throat cancer. 10/09/2015 -- Ultrasound of  the left lower extremity done which showed IMPRESSION: Complex collection within the lateral left lower calf measuring at least 3 x 2.8 x 7 cm. This may represent an evolving hematoma, but correlate clinically as infection/ abscess is not excluded. Radiology had spoken to me about this patient after the ultrasound and I had requested a radiologist to see her for a aspiration of the hematoma under ultrasound guidance. this appointment is still pending and we are trying to facilitate this for the patient and her daughter. 10/17/2015 -- had a ultrasound-guided aspiration on 10/09/2015, IMPRESSION: Successful aspiration yielding approximately 100 mL dark maroon blood. She is rapidly collected fluid back in the leg in spite of the aspiration. The wound is not open. 10/23/2015 -- she was seen by Dr. Caroleen Hamman in the office on 10/20/2015 and a office procedure with IandD of the left leg hematoma was done approximately 500 mL of old blood and clot was drained. no JP drain was left in situ so the patient had a 3 layer compression wrap applied over this. Angert, [place] ([id number]) Objective Constitutional Pulse regular. Respirations normal and unlabored. Afebrile. Vitals Time Taken: 8:52 AM, Temperature: 98.0 F, Pulse: 71 bpm, Respiratory Rate: 18 breaths/min, Blood Pressure: 101/79 mmHg. Eyes Nonicteric. Reactive to light. Ears, Nose, Mouth, and Throat Lips, teeth, and gums WNL.Marland Kitchen Moist mucosa without lesions. Neck supple and nontender. No palpable supraclavicular or cervical adenopathy. Normal sized without goiter. Respiratory WNL. No retractions.. Cardiovascular Pedal Pulses WNL. No clubbing, cyanosis or edema. Lymphatic No adneopathy. No adenopathy. No adenopathy. Musculoskeletal Adexa without tenderness or enlargement.. Digits and nails w/o clubbing, cyanosis, infection, petechiae, ischemia, or inflammatory conditions.Marland Kitchen Psychiatric Judgement and insight Intact.. No evidence of  depression, anxiety, or agitation.. General Notes: the opening in the left lateral compartment has closed down again and she has a recurrent seroma. There is no open wound at the present time. Integumentary (Hair, Skin) No suspicious lesions. No crepitus or fluctuance. No peri-wound warmth or erythema. No masses.. Wound #1 status is Open. Original cause of wound was Trauma. The wound is located on the Left,Lateral Lower Leg. The wound measures 2.6cm length x 0.5cm width x 0.2cm depth; 1.021cm^2 area and 0.204cm^3 volume. The wound is limited to skin breakdown. There is no tunneling or undermining noted. Fulfer, Deltha V. ([id number]) There is a small amount of serosanguineous drainage noted. There is no granulation within the wound bed. There is a large (67-100%) amount of necrotic tissue within the wound bed including Eschar. The periwound  skin appearance exhibited: Localized Edema. Periwound temperature was noted as No Abnormality. The periwound has tenderness on palpation. Assessment Active Problems ICD-10 M79.81 - Nontraumatic hematoma of soft tissue L97.222 - Non-pressure chronic ulcer of left calf with fat layer exposed Y29.XXXA - Contact with blunt object, undetermined intent, initial encounter The patient has closed the ulcerated area completely and has reformed a seroma. We will continue with local compression but I believe when she sees her surgeon on Wednesday she would probably benefit from going to the minor procedures room and having a JP drain placed in the depth so that the seroma could be evacuated on a regular basis in addition to the compression wraps. Plan Wound Cleansing: Wound #1 Left,Lateral Lower Leg: Clean wound with Normal Saline. Anesthetic: Wound #1 Left,Lateral Lower Leg: Topical Lidocaine 4% cream applied to wound bed prior to debridement Primary Wound Dressing: Wound #1 Left,Lateral Lower Leg: Dry Gauze Secondary Dressing: Wound #1 Left,Lateral Lower  Leg: ABD pad Dressing Change Frequency: Wound #1 Left,Lateral Lower Leg: Other: - pt see surgeon at [facility] on 11/01/15 Follow-up Appointments: Wound #1 Left,Lateral Lower Leg: Return Appointment in 1 week. Edema Control: Schmuck, Gudelia V. ([id number]) Wound #1 Left,Lateral Lower Leg: 2 Layer Lite Compression System - Left Lower Extremity Elevate legs to the level of the heart and pump ankles as often as possible The patient has closed the ulcerated area completely and has reformed a seroma. We will continue with local compression but I believe when she sees her surgeon on Wednesday she would probably benefit from going to the minor procedures room and having a JP drain placed in the depth so that the seroma could be evacuated on a regular basis in addition to the compression wraps. Electronic Signature(s) Signed: 10/30/2015 9:35:02 AM By: Christin Fudge MD, FACS Entered By: Christin Fudge on 10/30/2015 60:60:04 Rzepka, [name] ([id number]) -------------------------------------------------------------------------------- SuperBill Details Patient Name: Whobrey, [name] Date of Service: 10/30/2015 Medical Record Number: [id number] Patient Account Number: [id number] Date of Birth/Sex: September 18, 1924 (82 y.o. Female) Treating RN: Carolyne Fiscal, Debi Primary Care Physician: [name] Other Clinician: Referring Physician: [name] Treating Physician/Extender: Frann Rider in Treatment: 3 Diagnosis Coding ICD-10 Codes Code Description M79.81 Nontraumatic hematoma of soft tissue L97.222 Non-pressure chronic ulcer of left calf with fat layer exposed Y29.Merril Abbe Contact with blunt object, undetermined intent, initial encounter Physician Procedures CPT4 Code Description: [id number] - WC PHYS LEVEL 3 - EST PT ICD-10 Description Diagnosis M79.81 Nontraumatic hematoma of soft tissue L97.222 Non-pressure chronic ulcer of left calf with fat lay Y29.XXXA Contact with blunt object,  undetermined intent,  init Modifier: er exposed ial encounter Quantity: 1 Electronic Signature(s) Signed: 10/30/2015 9:35:13 AM By: Christin Fudge MD, FACS Entered By: Christin Fudge on 10/30/2015 09:35:13

## 2015-11-01 ENCOUNTER — Ambulatory Visit (INDEPENDENT_AMBULATORY_CARE_PROVIDER_SITE_OTHER): Payer: Medicare Other | Admitting: Surgery

## 2015-11-01 ENCOUNTER — Ambulatory Visit: Payer: Self-pay | Admitting: Surgery

## 2015-11-01 ENCOUNTER — Encounter: Payer: Self-pay | Admitting: Surgery

## 2015-11-01 ENCOUNTER — Other Ambulatory Visit: Payer: Self-pay

## 2015-11-01 VITALS — BP 108/74 | HR 69 | Temp 98.1°F | Ht 66.0 in | Wt 115.0 lb

## 2015-11-01 DIAGNOSIS — I872 Venous insufficiency (chronic) (peripheral): Secondary | ICD-10-CM

## 2015-11-01 MED ORDER — FUROSEMIDE 40 MG PO TABS: 40.0000 mg | ORAL_TABLET | Freq: Every day | ORAL | Status: DC

## 2015-11-01 NOTE — Progress Notes (Signed)
Mrs. [name] is following up for a left leg hematoma status post drainage. And she has done some compression to the lower extremities. She has had significant improvement however she feels there is some residual fluid and some mild intermittent left leg dull pain. No fevers no chills and no significant change in her baseline status  ROS: otherwise negative  PE debilitated elderly female in no acute distress comes in a wheelchair. Ext: Wrap removed and left leg examined there is some lower extremity edema and there is no evidence of expanding hematoma and no evidence of cellulitis or erythema. There is a small wound and I was able to probe it with a Q-tip and drained about 50 cc of serous fluid. At 3 layer compression wrap was applied.  A/P issue with venous insufficiency and developed lower extremity hematoma and now with some fluid that was drained maintenance therapy will consist of compression wrappings. We will also prescribe a short-term course of Lasix daily and we'll make sure that she has current creatinine. We will see her back in 2 weeks. And the daughter was advised on how to change to compression devices and apparently she has only appliances at home. No need for any surgical intervention.

## 2015-11-03 ENCOUNTER — Telehealth: Payer: Self-pay | Admitting: General Surgery

## 2015-11-03 NOTE — Telephone Encounter (Signed)
Patients daughter called and would like to have a home health nurse come into the home to help change bandages. Her mom is giving her a hard time and she wants to make sure it is done properly.

## 2015-11-06 NOTE — Telephone Encounter (Signed)
[facility] called back and is rejecting patient because they do not have a nurse in this area to see patient currently.

## 2015-11-06 NOTE — Telephone Encounter (Signed)
Spoke with patient's daughter at this time. She does not have a preference on [employer]. I explained that with patient having [employer] as secondary insurance we would have to go with who would take Tricare.   Sent information over to [place] at this time for nurse review will await call back regarding home care.

## 2015-11-07 NOTE — Telephone Encounter (Signed)
New referral placed to [facility] at this time. All information faxed over, awaiting call back from Sharmon Revere (Intake Nurse).

## 2015-11-08 NOTE — Telephone Encounter (Signed)
Spoke with [facility] this morning. Patient is being admitted today and will see home health nurse today. They have been communicating with patient's daughter to schedule home visits.

## 2015-11-09 ENCOUNTER — Ambulatory Visit: Payer: Medicare Other | Admitting: Surgery

## 2015-11-09 ENCOUNTER — Ambulatory Visit: Payer: Self-pay | Admitting: Surgery

## 2015-11-15 ENCOUNTER — Encounter: Payer: Self-pay | Admitting: General Surgery

## 2015-11-15 ENCOUNTER — Ambulatory Visit (INDEPENDENT_AMBULATORY_CARE_PROVIDER_SITE_OTHER): Payer: Medicare Other | Admitting: General Surgery

## 2015-11-15 ENCOUNTER — Telehealth: Payer: Self-pay

## 2015-11-15 VITALS — BP 98/66 | HR 69 | Temp 98.6°F

## 2015-11-15 DIAGNOSIS — Z4889 Encounter for other specified surgical aftercare: Secondary | ICD-10-CM

## 2015-11-15 NOTE — Progress Notes (Signed)
Outpatient Surgical Follow Up  05/19/2354  [name] is an 82 y.o. female.   Chief Complaint  Patient presents with  . Follow-up    Left Lower Wound    HPI:  82 year old female status post I&D of the left lower extremity hematoma returns to clinic for follow-up. Patient states she's not had any drainage many days and there is much smaller. She denies any complaints at all. Denies any fevers, chills, nausea, vomiting, diarrhea, constipation. Left lower extremity is nonpainful her only complaint is that she started having to wrap the area and compression. Desires to be done with therapy.  Past Medical History  Diagnosis Date  . Anemia   . Arthritis   . Cataract cortical, senile   . CVD (cardiovascular disease)   . Hearing loss of both ears     wears hearing aides  . Hypertension   . History of colonic polyps   . Throat cancer ([place]) 2005  . Unsteady gait   . Polycythemia     Past Surgical History  Procedure Laterality Date  . Joint replacement Left     11-12 years ago secondary to some from children    Family History  Problem Relation Age of Onset  . Hypertension Mother   . Hypertension Sister   . Diabetes Mellitus II Sister     Social History:  reports that she quit smoking about 12 years ago. Her smoking use included Cigarettes. She has a 50 pack-year smoking history. Her smokeless tobacco use includes Snuff. She reports that she drinks about 3.0 oz of alcohol per week. She reports that she does not use illicit drugs.  Allergies:  Allergies  Allergen Reactions  . Atorvastatin Nausea Only    Medications reviewed.    ROS  a multipoint review of systems was completed, all pertinent positives and negatives were documented within the history of present illness the remainder negative.   BP 98/66 mmHg  Pulse 69  Temp(Src) 98.6 F (37 C) (Oral)  Physical Exam   Gen.: No acute distress  chest: Clear to auscultation Heart: Regular rhythm Abdomen: Soft,  nontender Extremities: Left lower extremity site of I&D well-healed without any evidence of erythema or drainage. There is no palpable fluid collection.   No results found for this or any previous visit (from the past 48 hour(s)). No results found.  Assessment/Plan:  1. Aftercare following surgery  82 year old female who has a completely healed left lower shin hematoma site. No need for further dressings or compression. Patient will follow up on an as-needed basis.     Clayburn Pert, MD FACS General Surgeon  11/15/2015,1:52 PM

## 2015-11-15 NOTE — Telephone Encounter (Signed)
Called [facility] and spoke with BJ Pamala Hurry) Publishing copy and told her that patient was doing well and that her leg is completely healed and no longer is needing their services. BJ was happy to hear that patient was doing well. I told BJ that if we needed her again that we would call her back.

## 2016-01-10 ENCOUNTER — Ambulatory Visit (INDEPENDENT_AMBULATORY_CARE_PROVIDER_SITE_OTHER): Payer: Medicare Other | Admitting: General Surgery

## 2016-01-10 ENCOUNTER — Other Ambulatory Visit: Payer: Self-pay

## 2016-01-10 ENCOUNTER — Encounter: Payer: Self-pay | Admitting: General Surgery

## 2016-01-10 VITALS — Ht 66.0 in

## 2016-01-10 DIAGNOSIS — R600 Localized edema: Secondary | ICD-10-CM | POA: Diagnosis not present

## 2016-01-10 MED ORDER — MEDICAL COMPRESSION STOCKINGS MISC: 1.0000 | Freq: Every day | Status: DC

## 2016-01-10 NOTE — Patient Instructions (Signed)
Please pick up your Compression Stockings at: [facility] at [address] [place], [place] [address]  Make sure that you keep your legs elevated as much as possible to keep swelling down.  Your appointment with Dr. Kary Kos is tomorrow 01/11/16 at 1030am.

## 2016-01-10 NOTE — Progress Notes (Signed)
Outpatient Surgical Follow Up  1/85/6314  [name] is an 82 y.o. female.   Chief Complaint  Patient presents with  . Open Wound    Left Lateral Leg    HPI: A 82 year old female returns to clinic for follow-up due to worsening bilateral lower extremity swelling over the last 2-3 weeks. She has a significant history of a previous hematoma of the left lower extremity that required surgical drainage earlier this year. She is afraid that this is a recurrence. She denies any fevers, chills, nausea, vomiting, diarrhea compress patient. She does state that her left lower extremity hurt more than her right lower extremity. Both of them are swelling however. There has been no drainage or recurrence of fluid at the area of previous hematoma. She has not been seen by her primary care or any other provided for this problem.  Past Medical History  Diagnosis Date  . Anemia   . Arthritis   . Cataract cortical, senile   . CVD (cardiovascular disease)   . Hearing loss of both ears     wears hearing aides  . Hypertension   . History of colonic polyps   . Throat cancer ([place]) 2005  . Unsteady gait   . Polycythemia     Past Surgical History  Procedure Laterality Date  . Joint replacement Left     11-12 years ago secondary to some from children    Family History  Problem Relation Age of Onset  . Hypertension Mother   . Hypertension Sister   . Diabetes Mellitus II Sister     Social History:  reports that she quit smoking about 12 years ago. Her smoking use included Cigarettes. She has a 50 pack-year smoking history. Her smokeless tobacco use includes Snuff. She reports that she drinks about 3.0 oz of alcohol per week. She reports that she does not use illicit drugs.  Allergies:  Allergies  Allergen Reactions  . Atorvastatin Nausea Only    Medications reviewed.    ROS A multipoint review of systems was completed. All pertinent positives and negatives are documented within the  history of present illness and remainder are negative.   Ht '5\' 6"'$  (1.676 m)  Physical Exam  Gen.: No acute distress Chest: Clear to auscultation Heart: Regular rhythm Abdomen: Soft and nontender Extremities: Bilateral lower extremities examined with 2+ pitting edema to bilateral pretibial regions. The left lower extremity is more swollen than the right and mildly tender to palpation on the lateral aspect. Prior incision and drainage site well-healed without any evidence of infection or recurrence.   No results found for this or any previous visit (from the past 48 hour(s)). No results found.  Assessment/Plan:  1. Bilateral edema of lower extremity 82 year old female with bilateral lower extremity swelling. This has worsened over the last 2-3 weeks. She is on a chronic diuretic that has not changed a long time. She has not been using any compression. At this point it is time to start her on compression. We'll provide her with a prescription for compression stockings. She will need to follow-up with her primary care management for continued use of these as well as a likely change in her diuretic medications. We will refer her to her primary care provider. No surgical indications for this problem at this time. She can follow up with [place] on an as-needed basis.     Clayburn Pert, MD FACS General Surgeon  01/10/2016,2:33 PM

## 2017-04-16 ENCOUNTER — Emergency Department: Payer: Medicare Other

## 2017-04-16 ENCOUNTER — Encounter: Payer: Self-pay | Admitting: Emergency Medicine

## 2017-04-16 ENCOUNTER — Emergency Department
Admission: EM | Admit: 2017-04-16 | Discharge: 2017-04-16 | Disposition: A | Discharge status: Home / Self Care | Payer: Medicare Other | Attending: Emergency Medicine | Admitting: Emergency Medicine

## 2017-04-16 DIAGNOSIS — M546 Pain in thoracic spine: Secondary | ICD-10-CM | POA: Insufficient documentation

## 2017-04-16 DIAGNOSIS — R109 Unspecified abdominal pain: Secondary | ICD-10-CM | POA: Diagnosis present

## 2017-04-16 DIAGNOSIS — I10 Essential (primary) hypertension: Secondary | ICD-10-CM | POA: Diagnosis not present

## 2017-04-16 DIAGNOSIS — R059 Cough, unspecified: Secondary | ICD-10-CM

## 2017-04-16 DIAGNOSIS — R05 Cough: Secondary | ICD-10-CM | POA: Diagnosis not present

## 2017-04-16 DIAGNOSIS — F17220 Nicotine dependence, chewing tobacco, uncomplicated: Secondary | ICD-10-CM | POA: Insufficient documentation

## 2017-04-16 DIAGNOSIS — Z85819 Personal history of malignant neoplasm of unspecified site of lip, oral cavity, and pharynx: Secondary | ICD-10-CM | POA: Diagnosis not present

## 2017-04-16 DIAGNOSIS — Z79899 Other long term (current) drug therapy: Secondary | ICD-10-CM | POA: Insufficient documentation

## 2017-04-16 LAB — CBC
HCT: 43.6 % (ref 35.0–47.0)
Hemoglobin: 14.6 g/dL (ref 12.0–16.0)
MCH: 29.4 pg (ref 26.0–34.0)
MCHC: 33.4 g/dL (ref 32.0–36.0)
MCV: 87.9 fL (ref 80.0–100.0)
PLATELETS: 178 10*3/uL (ref 150–440)
RBC: 4.96 MIL/uL (ref 3.80–5.20)
RDW: 15 % — ABNORMAL HIGH (ref 11.5–14.5)
WBC: 7.9 10*3/uL (ref 3.6–11.0)

## 2017-04-16 LAB — URINALYSIS, COMPLETE (UACMP) WITH MICROSCOPIC
Bacteria, UA: NONE SEEN
Bilirubin Urine: NEGATIVE
GLUCOSE, UA: NEGATIVE mg/dL
HGB URINE DIPSTICK: NEGATIVE
Ketones, ur: NEGATIVE mg/dL
Leukocytes, UA: NEGATIVE
Nitrite: NEGATIVE
Protein, ur: NEGATIVE mg/dL
RBC / HPF: NONE SEEN RBC/hpf (ref 0–5)
SPECIFIC GRAVITY, URINE: 1.003 — AB (ref 1.005–1.030)
pH: 6 (ref 5.0–8.0)

## 2017-04-16 LAB — COMPREHENSIVE METABOLIC PANEL
ALK PHOS: 67 U/L (ref 38–126)
ALT: 15 U/L (ref 14–54)
AST: 22 U/L (ref 15–41)
Albumin: 3.8 g/dL (ref 3.5–5.0)
Anion gap: 8 (ref 5–15)
BUN: 10 mg/dL (ref 6–20)
CALCIUM: 9.1 mg/dL (ref 8.9–10.3)
CO2: 27 mmol/L (ref 22–32)
CREATININE: 0.8 mg/dL (ref 0.44–1.00)
Chloride: 105 mmol/L (ref 101–111)
GFR calc non Af Amer: >60 mL/min (ref >60)
GLUCOSE: 84 mg/dL (ref 65–99)
Potassium: 3.6 mmol/L (ref 3.5–5.1)
SODIUM: 140 mmol/L (ref 135–145)
Total Bilirubin: 1.8 mg/dL — ABNORMAL HIGH (ref 0.3–1.2)
Total Protein: 7.6 g/dL (ref 6.5–8.1)

## 2017-04-16 LAB — LIPASE, BLOOD: Lipase: 45 U/L (ref 11–51)

## 2017-04-16 MED ORDER — SODIUM CHLORIDE 0.9 % IV BOLUS (SEPSIS)
500.0000 mL | Freq: Once | INTRAVENOUS | Status: AC
  Administered 2017-04-16: 500 mL via INTRAVENOUS

## 2017-04-16 MED ORDER — OXYCODONE-ACETAMINOPHEN 5-325 MG PO TABS
1.0000 | ORAL_TABLET | Freq: Once | ORAL | Status: AC
  Administered 2017-04-16: 1 via ORAL
  Filled 2017-04-16: qty 1

## 2017-04-16 MED ORDER — CEPHALEXIN 500 MG PO CAPS: 500.0000 mg | ORAL_CAPSULE | Freq: Three times a day (TID) | ORAL | 0 refills | Status: AC

## 2017-04-16 MED ORDER — OXYCODONE-ACETAMINOPHEN 5-325 MG PO TABS: 1.0000 | ORAL_TABLET | Freq: Every evening | ORAL | 0 refills | Status: AC | PRN

## 2017-04-16 MED ORDER — IOPAMIDOL (ISOVUE-370) INJECTION 76%
60.0000 mL | Freq: Once | INTRAVENOUS | Status: AC | PRN
  Administered 2017-04-16: 60 mL via INTRAVENOUS

## 2017-04-16 NOTE — ED Provider Notes (Addendum)
[facility] Emergency Department Provider Note  ____________________________________________   I have reviewed the triage vital signs and the nursing notes.   HISTORY  Chief Complaint Abdominal Pain    HPI [name] is a 83 y.o. female with a history of significant HOH, remote history of throat cancer radiation, anemia, arthritis, "cardiovascular disease" hypertension, who presents today with pain in her back. Not abdominal pain. Back pain. She is very clear about this. It hurts in the ribs of the back. She has no chest pain, she states she is short of breath when she walks around and has been coughing. She denies any fever.  She denies any dysuria or urinary frequency or flank pain. She has had no numbness or weakness. She is very vague about how long the pain has been there. It is a "soreness". She does not endorse pleuritic chest pain necessarily. It seems to be more when she moves or changes position the runway. It is a sharp discomfort. No further history available.   Past Medical History:  Diagnosis Date  . Anemia   . Arthritis   . Cataract cortical, senile   . CVD (cardiovascular disease)   . Hearing loss of both ears    wears hearing aides  . History of colonic polyps   . Hypertension   . Polycythemia   . Throat cancer ([place]) 2005  . Unsteady gait     Patient Active Problem List   Diagnosis Date Noted  . Bilateral edema of lower extremity 01/10/2016  . Absolute anemia 10/20/2015  . Arthritis 10/20/2015  . Cataract cortical, senile 10/20/2015  . 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 Difficulty hearing 10/20/2015  . BP (high blood pressure) 10/20/2015  . History of colon polyps 10/20/2015  . 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 JOINT REPLACEMENT Left    11-12 years ago secondary to some from children    Prior to Admission  medications   Medication Sig Start Date End Date Taking? Authorizing Provider  amLODipine (NORVASC) 10 MG tablet Take by mouth. 08/17/14   [provider]  aspirin EC 325 MG tablet Take by mouth.    [provider]  Elastic Bandages & Supports (MEDICAL COMPRESSION STOCKINGS) MISC 1 Device by Does not apply route daily. 01/10/16   Clayburn Pert, MD  fluticasone (FLONASE) 50 MCG/ACT nasal spray Place 2 sprays into both nostrils daily. 11/22/15   [provider]  furosemide (LASIX) 40 MG tablet Take 1 tablet (40 mg total) by mouth daily. 11/01/15   Pabon, Marjory Lies, MD  hydrochlorothiazide (HYDRODIURIL) 25 MG tablet Take by mouth. 07/28/14   [provider]  pravastatin (PRAVACHOL) 40 MG tablet Take by mouth. 02/22/15   [provider]    Allergies Atorvastatin  Family History  Problem Relation Age of Onset  . Hypertension Mother   . Hypertension Sister   . Diabetes Mellitus II Sister     Social History Social History  Substance Use Topics  . Smoking status: Former Smoker    Packs/day: 1.00    Years: 50.00    Types: Cigarettes    Quit date: 09/17/2003  . Smokeless tobacco: Current User    Types: Snuff  . Alcohol use 3.0 oz/week    5 Cans of beer per week     Comment: "beer"    Review of Systems Constitutional: No fever/chills Eyes: No visual changes. ENT: No sore  throat. No stiff neck no neck pain Cardiovascular: Denies chest pain. Respiratory: Positive shortness of breath. Gastrointestinal:   no vomiting.  No diarrhea.  No constipation. Genitourinary: Negative for dysuria. Musculoskeletal: Negative lower extremity swelling Skin: Negative for rash. Neurological: Negative for severe headaches, focal weakness or numbness.   ____________________________________________   PHYSICAL EXAM:  VITAL SIGNS: ED Triage Vitals  Enc Vitals Group     BP 04/16/17 1142 140/76     Pulse Rate 04/16/17 1029 82     Resp 04/16/17 1029 20     Temp  04/16/17 1029 99 F (37.2 C)     Temp Source 04/16/17 1029 Oral     SpO2 04/16/17 1029 98 %     Weight 04/16/17 1029 115 lb (52.2 kg)     Height --      Head Circumference --      Peak Flow --      Pain Score 04/16/17 1029 10     Pain Loc --      Pain Edu? --      Excl. in [place]? --     Constitutional: Alert and oriented. Well appearing and in no acute distress. Eyes: Conjunctivae are normal Head: Atraumatic HEENT: No congestion/rhinnorhea. Mucous membranes are moist.  Oropharynx non-erythematous Neck:   Nontender with no meningismus, no masses, no stridor Cardiovascular: Normal rate, regular rhythm. Grossly normal heart sounds.  Good peripheral circulation. Respiratory: Normal respiratory effort.  Mildly diminished in the bases, no rales or rhonchi appreciated. Somewhat coarse breath sounds. Abdominal: Soft and nontender. No distention. No guarding no rebound Back:  Or some tenderness to palpation to the ribs on the left back. Not midline. There is no lesions, no shingles is no flail chest..  there is no midline tenderness there are no lesions noted. there is no CVA tenderness  Musculoskeletal: No lower extremity tenderness, no upper extremity tenderness. No joint effusions, no DVT signs strong distal pulses no edema Neurologic:  Normal speech and language. No gross focal neurologic deficits are appreciated.  Skin:  Skin is warm, dry and intact. No rash noted. Psychiatric: Mood and affect are normal. Speech and behavior are normal.  ____________________________________________   LABS (all labs ordered are listed, but only abnormal results are displayed)  Labs Reviewed  COMPREHENSIVE METABOLIC PANEL - Abnormal; Notable for the following:       Result Value   Total Bilirubin 1.8 (*)    All other components within normal limits  CBC - Abnormal; Notable for the following:    RDW 15.0 (*)    All other components within normal limits  URINALYSIS, COMPLETE (UACMP) WITH MICROSCOPIC -  Abnormal; Notable for the following:    Color, Urine STRAW (*)    APPearance CLEAR (*)    Specific Gravity, Urine 1.003 (*)    Squamous Epithelial / LPF 0-5 (*)    All other components within normal limits  LIPASE, BLOOD   ____________________________________________  EKG  I personally interpreted any EKGs ordered by me or triage Sinus rhythm rate 80 bpm no acute ST elevation or acute ST depression, LAD noted, no acute ischemic changes PVCs noted. ____________________________________________  [id number]  I reviewed any imaging ordered by me or triage that were performed during my shift and, if possible, patient and/or family made aware of any abnormal findings. ____________________________________________   PROCEDURES  Procedure(s) performed: None  Procedures  Critical Care performed: None  ____________________________________________   INITIAL IMPRESSION / ASSESSMENT AND PLAN / ED COURSE  Pertinent labs &  imaging results that were available during my care of the patient were reviewed by me and considered in my medical decision making (see chart for details).  Patient with very reproducible back pain. Chest x-ray shows a possible pneumonia. She also has noguarding her lungs from prior COPD. She is coughing. She may require antibiotic treatment pneumonia. My concern however since this is thoracic back pain, shortness of breath and a cancer survivor. No known history of DVT or PE however I do feel that further imaging should be of utility to further rule out the possibility of PE in this patient. She is also no abdominal pain is a triage missed medication which is not surprising given the patient's difficulty with hearing. She has pain in the ribs in the mid back on the left. Vital signs are reassuring, we'll give her pain medication, she does not appear to be in any significant distress although she is uncomfortable when she moves. Most likely this is musculoskeletal pain, but we  will evaluate further with imaging.  ----------------------------------------- 2:29 PM on 04/16/2017 -----------------------------------------  Is now reported by another family member who has arrived at the patient did try to move a couch a few days ago. I suspect this is the reason for her back pain. We will at this time discharge her. CT scan is noted. No evidence of infection. Family made aware of the need for repeat CT scan and "inflammatory" changes in the top right. This is really nothing to do with her reproducible left-sided pain. She is pain-free at this time. The family would like a Percocet for her to take at home at night so she can sleep for the next few days which we will do. During the day she can take Tylenol or Motrin. Patient advised to drink plenty of fluids after CT dye load. The patient's creatinine however was reassuring prior to it. In addition, given the family's concern about ongoing cough and how this is aggravating her back pain, I will send her home with a Zithromax. She does have a cough and I think is at risk for infection from splinting if she continues to have pain    ____________________________________________   FINAL CLINICAL IMPRESSION(S) / ED DIAGNOSES  Final diagnoses:  None      This chart was dictated using voice recognition software.  Despite best efforts to proofread,  errors can occur which can change meaning.      Schuyler Amor, MD 04/16/17 1206    Schuyler Amor, MD 04/16/17 1430

## 2017-04-16 NOTE — ED Notes (Signed)
First nurse: pt presents with shortness of breath and back pain. NAD>

## 2017-04-16 NOTE — ED Notes (Signed)
Pt taken to CT via stretcher.

## 2017-04-16 NOTE — Discharge Instructions (Signed)
Rest comfortably for the next few days. If there are any new or worrisome symptoms including worsening pain, fever, persistent cough, shortness of breath, numbness or weakness or other complaints return to the emergency department. Follow closely with primary care doctor. We do noticed some inflammatory changes which is possibly from the radiation on her CT scan, this will need to be repeated in a few months. Please let your physician know

## 2017-04-16 NOTE — ED Notes (Signed)
Patient was given a specimen cup to collect urine.

## 2017-04-16 NOTE — ED Triage Notes (Addendum)
Pt to ed with c/o left sided abd pain that started last night, pt rates 10/10.  Denies v/d, but reports nausea.  Pt family also reports sob worse with activity.

## 2020-06-22 ENCOUNTER — Encounter: Payer: Self-pay | Admitting: *Deleted

## 2020-06-22 NOTE — Progress Notes (Signed)
  Oncology Nurse Navigator Documentation  Navigator Location: [facility] (06/22/20 1100) Referral Date to RadOnc/MedOnc: 06/21/20 (06/22/20 1100) )Navigator Encounter Type: Introductory Phone Call (06/22/20 1100)   Abnormal Finding Date: 06/16/20 (06/22/20 1100)                   Treatment Phase: Abnormal Scans (06/22/20 1100) Barriers/Navigation Needs: Coordination of Care (06/22/20 1100)   Interventions: Coordination of Care (06/22/20 1100)   Coordination of Care: Appts;Radiology (06/22/20 1100)           phone call made to patient's daughter to review upcoming appts and introduce to navigator services. Pt's daughter informed that her mom will see Dr. Jacinto Reap on Fri 10/15 at 11:15am to discuss PET scan results and next steps. Pt is scheduled for PET scan at [facility] on 06/23/20. Will get images uploaded for review prior to appt. Contact info given and instructed to call with any further questions or needs. Pt's daughter verbalized understanding. Nothing further needed at this time.       Time Spent with Patient: 30 (06/22/20 1100)

## 2020-06-28 ENCOUNTER — Other Ambulatory Visit: Payer: Self-pay | Admitting: Internal Medicine

## 2020-06-28 ENCOUNTER — Ambulatory Visit
Admission: RE | Admit: 2020-06-28 | Discharge: 2020-06-28 | Disposition: A | Discharge status: Home / Self Care | Payer: Self-pay | Source: Ambulatory Visit | Attending: Internal Medicine | Admitting: Internal Medicine

## 2020-06-28 DIAGNOSIS — R918 Other nonspecific abnormal finding of lung field: Secondary | ICD-10-CM

## 2020-06-29 ENCOUNTER — Inpatient Hospital Stay: Payer: Medicare Other | Attending: Internal Medicine

## 2020-06-29 DIAGNOSIS — I2510 Atherosclerotic heart disease of native coronary artery without angina pectoris: Secondary | ICD-10-CM | POA: Insufficient documentation

## 2020-06-29 DIAGNOSIS — Z7982 Long term (current) use of aspirin: Secondary | ICD-10-CM | POA: Insufficient documentation

## 2020-06-29 DIAGNOSIS — I10 Essential (primary) hypertension: Secondary | ICD-10-CM | POA: Insufficient documentation

## 2020-06-29 DIAGNOSIS — Z79899 Other long term (current) drug therapy: Secondary | ICD-10-CM | POA: Insufficient documentation

## 2020-06-29 DIAGNOSIS — C3411 Malignant neoplasm of upper lobe, right bronchus or lung: Secondary | ICD-10-CM | POA: Insufficient documentation

## 2020-06-29 DIAGNOSIS — M1990 Unspecified osteoarthritis, unspecified site: Secondary | ICD-10-CM | POA: Insufficient documentation

## 2020-06-29 NOTE — Progress Notes (Signed)
Tumor Board Documentation  [name] Hirschi was presented by Norvel Richards, RN at our Tumor Board on 06/29/2020, which included representatives from medical oncology, radiation oncology, surgical oncology, internal medicine, navigation, pathology, radiology, surgical, pharmacy, genetics, research, palliative care, pulmonology.  Tangee currently presents as a new patient, for discussion with history of the following treatments: active survellience.  Additionally, we reviewed previous medical and familial history, history of present illness, and recent lab results along with all available histopathologic and imaging studies. The tumor board considered available treatment options and made the following recommendations:   Treatment to be determined, Possible XRT without a biopsy due to age and comorbidities  The following procedures/referrals were also placed: No orders of the defined types were placed in this encounter.   Clinical Trial Status: not discussed   Staging used: Clinical Stage  AJCC Staging:       Group: Stage III B Lung cancer   National site-specific guidelines   were discussed with respect to the case.  Tumor board is a meeting of clinicians from various specialty areas who evaluate and discuss patients for whom a multidisciplinary approach is being considered. Final determinations in the plan of care are those of the provider(s). The responsibility for follow up of recommendations given during tumor board is that of the provider.   Todays extended care, comprehensive team conference, Arlenne was not present for the discussion and was not examined.   Multidisciplinary Tumor Board is a multidisciplinary case peer review process.  Decisions discussed in the Multidisciplinary Tumor Board reflect the opinions of the specialists present at the conference without having examined the patient.  Ultimately, treatment and diagnostic decisions rest with the primary provider(s) and the  patient.

## 2020-06-29 NOTE — Progress Notes (Signed)
1456-Attempted to reach patient/patient's daughter prior to patient's apt tomorrow. The mail box is full and RN is unable to leave a VM.

## 2020-06-30 ENCOUNTER — Encounter (INDEPENDENT_AMBULATORY_CARE_PROVIDER_SITE_OTHER): Payer: Self-pay

## 2020-06-30 ENCOUNTER — Inpatient Hospital Stay (HOSPITAL_BASED_OUTPATIENT_CLINIC_OR_DEPARTMENT_OTHER): Payer: Medicare Other | Admitting: Internal Medicine

## 2020-06-30 ENCOUNTER — Encounter: Payer: Self-pay | Admitting: Internal Medicine

## 2020-06-30 ENCOUNTER — Telehealth: Payer: Self-pay | Admitting: *Deleted

## 2020-06-30 ENCOUNTER — Other Ambulatory Visit: Payer: Self-pay

## 2020-06-30 ENCOUNTER — Inpatient Hospital Stay: Payer: Medicare Other

## 2020-06-30 DIAGNOSIS — C3411 Malignant neoplasm of upper lobe, right bronchus or lung: Secondary | ICD-10-CM | POA: Diagnosis present

## 2020-06-30 DIAGNOSIS — Z79899 Other long term (current) drug therapy: Secondary | ICD-10-CM | POA: Diagnosis not present

## 2020-06-30 DIAGNOSIS — I2510 Atherosclerotic heart disease of native coronary artery without angina pectoris: Secondary | ICD-10-CM | POA: Diagnosis not present

## 2020-06-30 DIAGNOSIS — M1990 Unspecified osteoarthritis, unspecified site: Secondary | ICD-10-CM | POA: Diagnosis not present

## 2020-06-30 DIAGNOSIS — Z7982 Long term (current) use of aspirin: Secondary | ICD-10-CM | POA: Diagnosis not present

## 2020-06-30 DIAGNOSIS — I10 Essential (primary) hypertension: Secondary | ICD-10-CM | POA: Diagnosis not present

## 2020-06-30 NOTE — Telephone Encounter (Signed)
Spoke with pt's daughter after she was able to discuss options with pt after visit today. At this time, pt does not want to pursue any treatment and wishes to be referred to home based palliative care services.

## 2020-06-30 NOTE — Telephone Encounter (Signed)
Hayley- Thanks for talking to the daughter. It is fine with me. Please order home palliative care.  Thanks GB

## 2020-06-30 NOTE — Assessment & Plan Note (Addendum)
#  Right upper lobe suspected malignancy-stage III [T2N2]-with no evidence of distant metastases based on PET scan.   #Had a long discussion the patient's daughter [place] is extremely hard of hearing]-regarding in general treatment/plan for suspected lung cancer.  In general patient with recommended bronchoscopy biopsy.  However patient had recent stroke/on Plavix [recommend at least 21 days; prior to coming off for biopsy].  Also discussed the role of chemoradiation followed by maintenance immunotherapy for stage III lung cancers.  However in spite of aggressive therapy-in general only 10 to 20% of the patients are cured.   #However given patient's comorbidities/frail performance status/reluctant with any aggressive therapies-I think it is reasonable to offer palliative radiation if patient/family is interested.  Family is not too keen biopsy.  I am concerned about patient eligibility of systemic therapy/immunotherapy with a biopsy.  I think patient very high risk for side effects from chemotherapy.  Family is not interested in systemic therapy.  #Focal uptake in transverse colon-on PET scan concerning for primary chronic malignancy.  General recommendation colonoscopy.  Patient is not too keen.   # Thank you Dr.Hedrick for allowing me to participate in the care of your pleasant patient. Please do not hesitate to contact me with questions or concerns in the interim.  After lengthy discussion patient's daughter stated that she would want to speak to her mother regarding her preference.  She will inform us of their preference.  If they decide against radiation I would recommend evaluation with palliative care/hospice.  # I reviewed the blood work- with the patient in detail; also reviewed the imaging independently [as summarized above]; and with the patient in detail.   # DISPOSITION: # follow up TBD-Dr.B  Cc; Hayley

## 2020-06-30 NOTE — Progress Notes (Signed)
[facility] NOTE  Patient Care Team: [name], MD as PCP - General (Family Medicine) Telford Nab, RN as Oncology Nurse Navigator  CHIEF COMPLAINTS/PURPOSE OF CONSULTATION: lung nodule/mass  #  Oncology History Overview Note  # Throat cancer s/p [2004-2005]- s/p RT [[facility]]  # RUL lung cancer; with medistainal LN -Stage III [T2N2]; [[facility]; incidental Oct 2021-admitted TIA]; OCT 2021- [facility]; PET- - Intensely avid spiculated right upper lobe mass with precarinal and right perihilar nodal metastases. No definite evidence of more distant metastatic disease; -Foci of uptake in the transverse colon are concerning for primary colon malignancy; would correlate with colonoscopy.  # Colo [> 10 years]  # HOH; TIA  # SURVIVORSHIP:   # GENETICS:   DIAGNOSIS:   STAGE:   III      ;  GOALS:  CURRENT/MOST RECENT THERAPY :     Primary cancer of right upper lobe of lung ([place])  06/30/2020 Initial Diagnosis   Primary cancer of right upper lobe of lung ([facility])      HISTORY OF PRESENTING ILLNESS: Accompanied by daughter; extremely hard of hearing [name] 86 y.o.  female  history of smoking is here for further evaluation and recommendations for lung mass.  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   Patient was recently evaluated at [facility] she was admitted there for a TIA like symptoms.  On further work-up patient noted to have a right upper lobe mass also mediastinal adenopathy.  Also on PET scan noted to have a focus of uptake in the transverse colon.   As per the daughter patient has cough.  Has fatigue.  Otherwise no headaches.  No falls.  Patient spends most of her time resting in the bed or in chair.   Review of Systems  Constitutional: Positive for malaise/fatigue. Negative for chills, diaphoresis, fever and weight loss.  HENT: Negative for nosebleeds and sore throat.   Eyes: Negative for double vision.  Respiratory: Positive  for cough. Negative for hemoptysis, sputum production, shortness of breath and wheezing.   Cardiovascular: Negative for chest pain, palpitations, orthopnea and leg swelling.  Gastrointestinal: Negative for abdominal pain, blood in stool, constipation, diarrhea, heartburn, melena, nausea and vomiting.  Genitourinary: Negative for dysuria, frequency and urgency.  Musculoskeletal: Positive for back pain and joint pain.  Skin: Negative.  Negative for itching and rash.  Neurological: Negative for dizziness, tingling, focal weakness, weakness and headaches.  Endo/Heme/Allergies: Does not bruise/bleed easily.  Psychiatric/Behavioral: Negative for depression. The patient is not nervous/anxious and does not have insomnia.      MEDICAL HISTORY:  Past Medical History:  Diagnosis Date  . Anemia   . Arthritis   . Cataract cortical, senile   . CVD (cardiovascular disease)   . Hearing loss of both ears    wears hearing aides  . History of colonic polyps   . Hypertension   . Polycythemia   . Throat cancer ([place]) 2005  . Unsteady gait     SURGICAL HISTORY: Past Surgical History:  Procedure Laterality Date  . JOINT REPLACEMENT Left    11-12 years ago secondary to some from children    SOCIAL HISTORY: Social History   Socioeconomic History  . Marital status: Widowed    Spouse name: Not on file  . Number of children: Not on file  . Years of education: Not on file  . Highest education level: Not on file  Occupational History  . Not on file  Tobacco Use  .  Smoking status: Former Smoker    Packs/day: 1.00    Years: 50.00    Pack years: 50.00    Types: Cigarettes    Quit date: 09/17/2003    Years since quitting: 16.7  . Smokeless tobacco: Current User    Types: Snuff  Substance and Sexual Activity  . Alcohol use: Yes    Alcohol/week: 5.0 standard drinks    Types: 5 Cans of beer per week    Comment: "beer"  . Drug use: No  . Sexual activity: Never  Other Topics Concern  . Not on  file  Social History Narrative   Lives in [place]; lives with daughter. Quit smoking in 16 years ago; continues to snuff. 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    Social Determinants of Health   Financial Resource Strain:   . Difficulty of Paying Living Expenses: Not on file  Food Insecurity:   . Worried About Charity fundraiser in the Last Year: Not on file  . Ran Out of Food in the Last Year: Not on file  Transportation Needs:   . Lack of Transportation (Medical): Not on file  . Lack of Transportation (Non-Medical): Not on file  Physical Activity:   . Days of Exercise per Week: Not on file  . Minutes of Exercise per Session: Not on file  Stress:   . Feeling of Stress : Not on file  Social Connections:   . Frequency of Communication with Friends and Family: Not on file  . Frequency of Social Gatherings with Friends and Family: Not on file  . Attends Religious Services: Not on file  . Active Member of Clubs or Organizations: Not on file  . Attends Archivist Meetings: Not on file  . Marital Status: Not on file  Intimate Partner Violence:   . Fear of Current or Ex-Partner: Not on file  . Emotionally Abused: Not on file  . Physically Abused: Not on file  . Sexually Abused: Not on file    FAMILY HISTORY: Family History  Problem Relation Age of Onset  . Hypertension Mother   . Hypertension Sister   . Diabetes Mellitus II Sister     ALLERGIES:  is allergic to atorvastatin.  MEDICATIONS:  Current Outpatient Medications  Medication Sig Dispense Refill  . aspirin EC 81 MG tablet Take 81 mg by mouth daily. Swallow whole.    . clopidogrel (PLAVIX) 75 MG tablet Take 75 mg by mouth daily.    . fluticasone (FLONASE) 50 MCG/ACT nasal spray Place 2 sprays into both nostrils daily.  1  . pravastatin (PRAVACHOL) 40 MG tablet Take by mouth.    Marland Kitchen amLODipine (NORVASC) 10 MG tablet Take by mouth. (Patient not taking: Reported on 06/30/2020)     No current facility-administered  medications for this visit.      Marland Kitchen  PHYSICAL EXAMINATION: ECOG PERFORMANCE STATUS: 3 - Symptomatic, >50% confined to bed  Vitals:   06/30/20 1116  BP: (!) 144/83  Pulse: 76  Resp: 16  Temp: 97.7 F (36.5 C)  SpO2: 98%   Filed Weights   06/30/20 1116  Weight: 119 lb (54 kg)    Physical Exam Constitutional:      Comments: Frail appearing. In a wheel chair.   HENT:     Head: Normocephalic and atraumatic.     Mouth/Throat:     Pharynx: No oropharyngeal exudate.  Eyes:     Pupils: Pupils are equal, round, and reactive to light.  Cardiovascular:  Rate and Rhythm: Normal rate and regular rhythm.  Pulmonary:     Effort: No respiratory distress.     Breath sounds: No wheezing.     Comments: Decreased breath sounds BIL.  Abdominal:     General: Bowel sounds are normal. There is no distension.     Palpations: Abdomen is soft. There is no mass.     Tenderness: There is no abdominal tenderness. There is no guarding or rebound.  Musculoskeletal:        General: No tenderness. Normal range of motion.     Cervical back: Normal range of motion and neck supple.  Skin:    General: Skin is warm.  Neurological:     Mental Status: She is alert and oriented to person, place, and time.  Psychiatric:        Mood and Affect: Affect normal.      LABORATORY DATA:  I have reviewed the data as listed Lab Results  Component Value Date   WBC 7.9 04/16/2017   HGB 14.6 04/16/2017   HCT 43.6 04/16/2017   MCV 87.9 04/16/2017   PLT 178 04/16/2017   No results for input(s): NA, K, CL, CO2, GLUCOSE, BUN, CREATININE, CALCIUM, GFRNONAA, GFRAA, PROT, ALBUMIN, AST, ALT, ALKPHOS, BILITOT, BILIDIR, IBILI in the last 8760 hours.  RADIOGRAPHIC STUDIES: I have personally reviewed the radiological images as listed and agreed with the findings in the report. CT OUTSIDE FILMS CHEST  Result Date: 06/28/2020 This examination belongs to an outside facility and is stored here for comparison  purposes only.  Contact the originating outside institution for any associated report or interpretation.  CT OUTSIDE FILMS HEAD/FACE  Result Date: 06/28/2020 This examination belongs to an outside facility and is stored here for comparison purposes only.  Contact the originating outside institution for any associated report or interpretation.  CT OUTSIDE FILMS SPINE  Result Date: 06/28/2020 This examination belongs to an outside facility and is stored here for comparison purposes only.  Contact the originating outside institution for any associated report or interpretation.   ASSESSMENT & PLAN:   Primary cancer of right upper lobe of lung ([place]) #Right upper lobe suspected malignancy-stage III [T2N2]-with no evidence of distant metastases based on PET scan.   #Had a long discussion the patient's daughter Alto [place] is extremely hard of hearing]-regarding in general treatment/plan for suspected lung cancer.  In general patient with recommended bronchoscopy biopsy.  However patient had recent stroke/on Plavix [recommend at least 21 days; prior to coming off for biopsy].  Also discussed the role of chemoradiation followed by maintenance immunotherapy for stage III lung cancers.  However in spite of aggressive therapy-in general only 10 to 20% of the patients are cured.   #However given patient's comorbidities/frail performance status/reluctant with any aggressive therapies-I think it is reasonable to offer palliative radiation if patient/family is interested.  Family is not too keen biopsy.  I am concerned about patient eligibility of systemic therapy/immunotherapy with a biopsy.  I think patient very high risk for side effects from chemotherapy.  Family is not interested in systemic therapy.  #Focal uptake in transverse colon-on PET scan concerning for primary chronic malignancy.  General recommendation colonoscopy.  Patient is not too keen.   # Thank you Dr.Hedrick for allowing me to participate  in the care of your pleasant patient. Please do not hesitate to contact me with questions or concerns in the interim.  After lengthy discussion patient's daughter stated that she would want to speak to her mother  regarding her preference.  She will inform us of their preference.  If they decide against radiation I would recommend evaluation with palliative care/hospice.  # I reviewed the blood work- with the patient in detail; also reviewed the imaging independently [as summarized above]; and with the patient in detail.   # DISPOSITION: # follow up TBD-Dr.B  Cc; [place]    All questions were answered. The patient knows to call the clinic with any problems, questions or concerns.     Cammie Sickle, MD 06/30/2020 2:47 PM

## 2020-07-03 ENCOUNTER — Telehealth: Payer: Self-pay | Admitting: Primary Care

## 2020-07-03 NOTE — Telephone Encounter (Signed)
Referral has been placed. 

## 2020-07-03 NOTE — Telephone Encounter (Signed)
Spoke with patient's daughter, Glenard Haring, regarding Palliative referral/services and she was in agreement with scheduling visit.  I have scheduled an In-person Consult for 07/06/20 @ 2:30 PM.

## 2020-07-05 ENCOUNTER — Encounter: Payer: Self-pay | Admitting: Emergency Medicine

## 2020-07-05 ENCOUNTER — Emergency Department
Admission: EM | Admit: 2020-07-05 | Discharge: 2020-07-05 | Disposition: A | Discharge status: Home / Self Care | Payer: Medicare Other | Attending: Emergency Medicine | Admitting: Emergency Medicine

## 2020-07-05 ENCOUNTER — Emergency Department: Payer: Medicare Other

## 2020-07-05 ENCOUNTER — Other Ambulatory Visit: Payer: Self-pay

## 2020-07-05 DIAGNOSIS — Z79899 Other long term (current) drug therapy: Secondary | ICD-10-CM | POA: Insufficient documentation

## 2020-07-05 DIAGNOSIS — R778 Other specified abnormalities of plasma proteins: Secondary | ICD-10-CM

## 2020-07-05 DIAGNOSIS — Z7982 Long term (current) use of aspirin: Secondary | ICD-10-CM | POA: Diagnosis not present

## 2020-07-05 DIAGNOSIS — Z85818 Personal history of malignant neoplasm of other sites of lip, oral cavity, and pharynx: Secondary | ICD-10-CM | POA: Insufficient documentation

## 2020-07-05 DIAGNOSIS — R42 Dizziness and giddiness: Secondary | ICD-10-CM

## 2020-07-05 DIAGNOSIS — R7989 Other specified abnormal findings of blood chemistry: Secondary | ICD-10-CM | POA: Diagnosis not present

## 2020-07-05 DIAGNOSIS — R531 Weakness: Secondary | ICD-10-CM | POA: Insufficient documentation

## 2020-07-05 DIAGNOSIS — I10 Essential (primary) hypertension: Secondary | ICD-10-CM | POA: Diagnosis not present

## 2020-07-05 DIAGNOSIS — Z87891 Personal history of nicotine dependence: Secondary | ICD-10-CM | POA: Insufficient documentation

## 2020-07-05 DIAGNOSIS — Z96652 Presence of left artificial knee joint: Secondary | ICD-10-CM | POA: Insufficient documentation

## 2020-07-05 LAB — URINALYSIS, COMPLETE (UACMP) WITH MICROSCOPIC
Bilirubin Urine: NEGATIVE
Glucose, UA: NEGATIVE mg/dL
Hgb urine dipstick: NEGATIVE
Ketones, ur: NEGATIVE mg/dL
Leukocytes,Ua: NEGATIVE
Nitrite: NEGATIVE
Protein, ur: NEGATIVE mg/dL
Specific Gravity, Urine: 1.018 (ref 1.005–1.030)
pH: 7 (ref 5.0–8.0)

## 2020-07-05 LAB — CBC WITH DIFFERENTIAL/PLATELET
Abs Immature Granulocytes: 0.03 10*3/uL (ref 0.00–0.07)
Basophils Absolute: 0.1 10*3/uL (ref 0.0–0.1)
Basophils Relative: 1 %
Eosinophils Absolute: 0.2 10*3/uL (ref 0.0–0.5)
Eosinophils Relative: 3 %
HCT: 41.8 % (ref 36.0–46.0)
Hemoglobin: 12.9 g/dL (ref 12.0–15.0)
Immature Granulocytes: 1 %
Lymphocytes Relative: 26 %
Lymphs Abs: 1.7 10*3/uL (ref 0.7–4.0)
MCH: 25.5 pg — ABNORMAL LOW (ref 26.0–34.0)
MCHC: 30.9 g/dL (ref 30.0–36.0)
MCV: 82.6 fL (ref 80.0–100.0)
Monocytes Absolute: 0.4 10*3/uL (ref 0.1–1.0)
Monocytes Relative: 6 %
Neutro Abs: 4.2 10*3/uL (ref 1.7–7.7)
Neutrophils Relative %: 63 %
Platelets: 179 10*3/uL (ref 150–400)
RBC: 5.06 MIL/uL (ref 3.87–5.11)
RDW: 17.5 % — ABNORMAL HIGH (ref 11.5–15.5)
WBC: 6.6 10*3/uL (ref 4.0–10.5)
nRBC: 0 % (ref 0.0–0.2)

## 2020-07-05 LAB — TROPONIN I (HIGH SENSITIVITY)
Troponin I (High Sensitivity): 126 ng/L (ref <18)
Troponin I (High Sensitivity): 173 ng/L (ref <18)

## 2020-07-05 LAB — COMPREHENSIVE METABOLIC PANEL
ALT: 18 U/L (ref 0–44)
AST: 22 U/L (ref 15–41)
Albumin: 3.5 g/dL (ref 3.5–5.0)
Alkaline Phosphatase: 57 U/L (ref 38–126)
Anion gap: 9 (ref 5–15)
BUN: 16 mg/dL (ref 8–23)
CO2: 26 mmol/L (ref 22–32)
Calcium: 8.9 mg/dL (ref 8.9–10.3)
Chloride: 105 mmol/L (ref 98–111)
Creatinine, Ser: 0.85 mg/dL (ref 0.44–1.00)
GFR, Estimated: 59 mL/min — ABNORMAL LOW (ref >60)
Glucose, Bld: 93 mg/dL (ref 70–99)
Potassium: 4 mmol/L (ref 3.5–5.1)
Sodium: 140 mmol/L (ref 135–145)
Total Bilirubin: 1.1 mg/dL (ref 0.3–1.2)
Total Protein: 7.7 g/dL (ref 6.5–8.1)

## 2020-07-05 NOTE — ED Notes (Signed)
Patient transported to CT 

## 2020-07-05 NOTE — Discharge Instructions (Signed)
Please seek medical attention for any high fevers, chest pain, shortness of breath, change in behavior, persistent vomiting, bloody stool or any other new or concerning symptoms.  

## 2020-07-05 NOTE — ED Notes (Signed)
Assumed care of pt upon being roomed. Daughter at bedside and reports pt appears to "almost" be back to baseline, pt still needed assistance from moving from chair to stretcher which is not her normal. Pt legally deaf. Able to follow commands when physically directed. +3 strength noted to LUE and LLE, +4 noted in RLE and RUE

## 2020-07-05 NOTE — ED Provider Notes (Signed)
[facility] Emergency Department Provider Note  ____________________________________________   I have reviewed the triage vital signs and the nursing notes.   HISTORY  Chief Complaint Weakness   History limited by and level 5 caveat due to: Legally deaf. History obtained from daughter at bedside  HPI [name] is a 86 y.o. female who presents to the emergency department today because of concern for weakness and dizziness. Patient first noticed these symptoms this morning when she got out of bed to use the restroom. She was not able to ambulate on her own and needed the assistance of family. At that time family also noticed the patient seemed a little anxious and had some change in her speech. By the time of my exam the patient is returning to her baseline with more normal speech and seems calmer per family. They states she had a normal day yesterday. Ate and drank her normal amount. No recent falls. Per family patient has not been complaining of any pain or change in bladder or bowel habits.   Records reviewed. Per medical record review patient has a history of recent diagnosis of lung cancer. Per work up so far no evidence of brain metastasis.   Past Medical History:  Diagnosis Date  . Anemia   . Arthritis   . Cataract cortical, senile   . CVD (cardiovascular disease)   . Hearing loss of both ears    wears hearing aides  . History of colonic polyps   . Hypertension   . Polycythemia   . Throat cancer ([place]) 2005  . Unsteady gait     Patient Active Problem List   Diagnosis Date Noted  . Primary cancer of right upper lobe of lung ([place]) 06/30/2020  . Bilateral edema of lower extremity 01/10/2016  . Absolute anemia 10/20/2015  . Arthritis 10/20/2015  . Cataract cortical, senile 10/20/2015  . 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 Difficulty hearing 10/20/2015  . BP (high blood pressure) 10/20/2015  . History of  colon polyps 10/20/2015  . Malignant neoplasm of pharynx ([place]) 10/20/2015    Past Surgical History:  Procedure Laterality Date  . JOINT REPLACEMENT Left    11-12 years ago secondary to some from children    Prior to Admission medications   Medication Sig Start Date End Date Taking? Authorizing Provider  amLODipine (NORVASC) 10 MG tablet Take by mouth. Patient not taking: Reported on 06/30/2020 08/17/14   [provider]  aspirin EC 81 MG tablet Take 81 mg by mouth daily. Swallow whole.    [provider]  clopidogrel (PLAVIX) 75 MG tablet Take 75 mg by mouth daily. 06/12/20   [provider]  fluticasone (FLONASE) 50 MCG/ACT nasal spray Place 2 sprays into both nostrils daily. 11/22/15   [provider]  pravastatin (PRAVACHOL) 40 MG tablet Take by mouth. 02/22/15   [provider]    Allergies Atorvastatin  Family History  Problem Relation Age of Onset  . Hypertension Mother   . Hypertension Sister   . Diabetes Mellitus II Sister     Social History Social History   Tobacco Use  . Smoking status: Former Smoker    Packs/day: 1.00    Years: 50.00    Pack years: 50.00    Types: Cigarettes    Quit date: 09/17/2003    Years since quitting: 16.8  . Smokeless tobacco: Current User    Types: Snuff  Substance Use Topics  . Alcohol use: Yes  Alcohol/week: 5.0 standard drinks    Types: 5 Cans of beer per week    Comment: "beer"  . Drug use: No    Review of Systems Unable to obtain secondary to deaf status.  ____________________________________________   PHYSICAL EXAM:  VITAL SIGNS: ED Triage Vitals  Enc Vitals Group     BP 07/05/20 0601 (!) 159/73     Pulse Rate 07/05/20 0601 79     Resp 07/05/20 0601 18     Temp 07/05/20 0601 98.6 F (37 C)     Temp Source 07/05/20 0601 Oral     SpO2 07/05/20 0556 97 %     Weight 07/05/20 0602 119 lb (54 kg)     Height 07/05/20 0602 5\' 7"  (1.702 m)     Head Circumference --      Peak  Flow --      Pain Score 07/05/20 0601 0   Constitutional: Awake and alert.  Eyes: Conjunctivae are normal.  ENT      Head: Normocephalic and atraumatic.      Nose: No congestion/rhinnorhea.      Mouth/Throat: Mucous membranes are moist.      Neck: No stridor. Hematological/Lymphatic/Immunilogical: No cervical lymphadenopathy. Cardiovascular: Normal rate, regular rhythm.  No murmurs, rubs, or gallops.  Respiratory: Normal respiratory effort without tachypnea nor retractions. Breath sounds are clear and equal bilaterally. No wheezes/rales/rhonchi. Gastrointestinal: Soft and non tender. No rebound. No guarding.  Genitourinary: Deferred Musculoskeletal: Normal range of motion in all extremities. No lower extremity edema. Neurologic:  Awake and alert. Follows commands. Appears to be moving all extremities.  Skin:  Skin is warm, dry and intact. No rash noted. ____________________________________________    LABS (pertinent positives/negatives)  CBC wbc 6.6, hgb 12.9, plt 179 Trop hs 126 to 173 CMP na 140, k 4.0, cl 105, glu 93, cr 0.85 UA cloudy, pH 7.0, 6-10 rbc, 0-5 wbc, rare bacteria ____________________________________________   EKG  I, Nance Pear, attending physician, personally viewed and interpreted this EKG  EKG Time: 0610 Rate: 79 Rhythm: normal sinus rhythm Axis: left axis deviation Intervals: qtc 467 QRS: LAFB ST changes: no st elevation Impression: abnormal ekg  ____________________________________________    RADIOLOGY  CT head No acute abnormality. Age indeterminate small vessel infarcts.   ____________________________________________   PROCEDURES  Procedures  ____________________________________________   INITIAL IMPRESSION / ASSESSMENT AND PLAN / ED COURSE  Pertinent labs & imaging results that were available during my care of the patient were reviewed by me and considered in my medical decision making (see chart for details).   Patient  presented to the emergency department today because of concern for weakness and dizziness that family noted this morning. They did state that her symptoms had improved by the time of my exam. Head ct was negative for acute finding. Blood work without evidence of infection, and UA not consistent with infection however will send culture. Patient's troponin was initially elevated and repeat shows more elevation. I had a discussion with patient's daughter about this finding. Family was familiar with troponin given elevation she has had previously at [facility]. Discussed today that it could be a sign of heart attack and that without further work up we could not rule out heart damage. Did offer admission for further cardiac work up however at this time family and patient comfortable with going home. We did discuss that she could have a large heart attack that could be fatal. Also discussed negative head CT and that MRI could show strokes that  CT missed. Again family felt comfortable with patient going home. They understand that she is likely going to get more sick in the near future. At this time I do think it is reasonable for patient to be discharged home. She has palliative care appointment scheduled for tomorrow.  ____________________________________________   FINAL CLINICAL IMPRESSION(S) / ED DIAGNOSES  Final diagnoses:  Weakness  Dizziness  Elevated troponin     Note: This dictation was prepared with Dragon dictation. Any transcriptional errors that result from this process are unintentional     Nance Pear, MD 07/05/20 1108

## 2020-07-05 NOTE — ED Triage Notes (Addendum)
Pt to lobby via w/c with no distress noted; EMS brings pt in from home; hx CVA 2wks ago so family wanted pt eval; pt accomp by daughter who reports this morning at 440am she checked on pt and noted her balance was off; pt normally ambulatory without diff with aid of walker; stage 3 lung CA newly dx, under palliative care; daughter st pt was "holding her head and said she didn't feel right"; st pt seems more at baseline at present

## 2020-07-06 ENCOUNTER — Other Ambulatory Visit: Payer: Medicare Other | Admitting: Primary Care

## 2020-07-06 DIAGNOSIS — H9193 Unspecified hearing loss, bilateral: Secondary | ICD-10-CM

## 2020-07-06 DIAGNOSIS — Z515 Encounter for palliative care: Secondary | ICD-10-CM

## 2020-07-06 DIAGNOSIS — C3411 Malignant neoplasm of upper lobe, right bronchus or lung: Secondary | ICD-10-CM

## 2020-07-06 LAB — URINE CULTURE: Culture: NO GROWTH

## 2020-07-06 NOTE — Progress Notes (Signed)
[facility] Palliative Care Consult Note Telephone: [phone]  Fax: [phone]  PATIENT NAME: [name] [address] [place] [address] [phone] (home)  DOB: 02/22/1925 MRN: [id number]  PRIMARY CARE PROVIDER:    [name], MD,  [address] [place] [address] [phone]  REFERRING PROVIDER:  Cammie Sickle, MD [address] [place],  [place] [address]  RESPONSIBLE PARTY:   Extended Emergency Contact Information Primary Emergency Contact: Orion Crook Address: [place], [place] [address] [place] Mobile Phone: [phone] Relation: Daughter  I met face to face with patient and family in the  home.   ASSESSMENT AND RECOMMENDATIONS:   1. Advance Care Planning/Goals of Care: Goals include to maximize quality of life and symptom management. Our advance care planning conversation included a discussion about:     The value and importance of advance care planning   Experiences with loved ones who have been seriously ill or have died   Exploration of personal, cultural or spiritual beliefs that might influence medical decisions   Exploration of goals of care in the event of a sudden injury or illness   Identification and preparation of a healthcare agent - Daughter [name] of an  advance directive document- MOST and DNR  Decision to de-escalate disease focused treatments due to poor prognosis.  2. Symptom Management:   I met with patient and her daughter today in her home. 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 She has elected not to pursue any diagnosic or treatment modalities with this disease,due to her age and stated readiness to pass on. Her daughter voices at this is hard for her to watch but that she wants to honor her  mother's wishes.   Patient is extremely hard of hearing and is considered legally deaf. She is present for our conversation and has an amplifier earbud set but it's difficult for her to participate. Daughter states that in the last month she's declined significantly from being able to walk with her walker around the house anywhere she wanted to go, eating well, and interacting to now sleeping more, eating less and less, and not being able to get out of bed without two assistants; largely bedbound now. She does not have pain or dyspnea. She is living mostly on boost drinks not eating much food. Her weight is 119, down from reported 129 lbs. BMI 19.2  We discussed hospice services given her rate of decline and ultimate disease process. Daughter is ready to have the hospice support. She voices that their goal is for the patient to stay home and have comfort measures. She states the patient does not want to go back to the emergency room. She was in the emergency room this week for a weakness episode. Nothing was found on labs or scans and the daughter feels that getting diagnostics are likely not going to change any outcomes. She voices that she subscribes to the Hospice philosophy of care, to have symptoms managed and to have the family supported in the home. We discussed hospice home admission if that needs to happen at a later date. Her father had passed away at the hospice [facility] in another city. I will facilitate hospice referral per family request.  3. Follow up Palliative Care  Visit: Recommend hospice admission due to debility and poor intake.Palliative care will continue to follow for goals of care clarification and symptom management if not admitted to hospice. Return  prn.  4. Family /Caregiver/Community Supports: Lives in home with daugther, son who has health issues, and other family members. Daughter works and does caretaking as well. Needs support for adls, grooming, linen changes.  5. Cognitive /  Functional decline: A and O x 3 but deaf, so difficult to communicate. Daughter assists. Decline from 50 to 30 % PPS over 1-2 months. Now dependent in all iadls, most iadls.  I spent 75 minutes providing this consultation,  from 8400 to 8415. More than 50% of the time in this consultation was spent coordinating communication.   CHIEF COMPLAINT: fatigue, weakness, immobility.  HISTORY OF PRESENT ILLNESS:  FORESTINE MACHO is a 86 y.o. year old female  with lung ca with meds, tia's and debililty . We are asked to consult around goals of care in the context of not pursuing treatment for lung cancer, and supporting goals of care at EOL.    Review and summarization of old Epic records shows or history from other than patient  shows consultation with oncology provider.  Review or lab tests, radiology,  or medicine shows reports of scans in 10/21 which shows suspected malignancy in R upper lung lobe for primary  and transverse colon for primary disease.  Review of case with family member [name] who gives history and current status of goals of care.  . Palliative Care was asked to follow this patient by consultation request of Cammie Sickle, *  to help address advance care planning and goals of care. This is the initial  visit.  CODE STATUS: DNR, MOST with DNR, comfort, use of abx, use of IV case by case, no feeding tube.  PPS: 30%  HOSPICE ELIGIBILITY/DIAGNOSIS: yes/lung and colon cancer  ROS  General: NAD EYES: denies vision changes, wears glasses ENMT: endorses deafness, denies dysphagia Cardiovascular: denies chest pain Pulmonary: denies  cough, denies increased SOB at rest Abdomen: endorses fair appetite, denies  constipation, endorses continence of bowel GU: denies dysuria, endorses continence of urine MSK:  endorses ROM limitations, no falls reported Skin: denies rashes or wounds, dry skin Neurological: endorses weakness, endorses dizziness at times, denies pain, denies  insomnia, sleeping more. Psych: Endorses positive mood   Physical Exam: Current and past weights:Current wt  119 lbs, BMI 19.2,  10/20 129 lbs; 10 lb wt loss Constitutional: 148/81 HR 79 RR 18 General :frail appearing, thin EYES: anicteric sclera, lids intact, no discharge  ENMT: legally deaf/unhearing,oral mucous membranes dry, dentition intact CV: S1S2, RRR, no LE edema Pulmonary: LCTA, diminished air movement, no increased work of breathing at rest, no cough, no audible wheezes, room air Abdomen: intake 25-50%, normo-active BS +  4 quadrants, soft and non tender, no ascites GU: deferred MSK: severe  sacropenia, decreased ROM in all extremities, no contractures of LE, non ambulatory/ 2 people to pivot transfer Skin: warm and dry, no rashes or wounds on visible skin Neuro: Increased weakness, possible tia/transient LOC changes, Cognitively intact Psych: non-anxious affect, A and O x 3  CURRENT PROBLEM LIST:  Patient Active Problem List   Diagnosis Date Noted  . Primary cancer of right upper lobe of lung ([place]) 06/30/2020  . Bilateral edema of lower extremity 01/10/2016  . Absolute anemia 10/20/2015  . Arthritis 10/20/2015  . Cataract cortical, senile 10/20/2015  . Cardiovascular degeneration (with mention of arteriosclerosis)  10/20/2015  . Difficulty hearing 10/20/2015  . BP (high blood pressure) 10/20/2015  . History of colon polyps 10/20/2015  . Malignant neoplasm of pharynx ([place]) 10/20/2015   PAST MEDICAL HISTORY:  Past Medical History:  Diagnosis Date  . Anemia   . Arthritis   . Cataract cortical, senile   . CVD (cardiovascular disease)   . Hearing loss of both ears    wears hearing aides  . History of colonic polyps   . Hypertension   . Polycythemia   . Throat cancer ([place]) 2005  . Unsteady gait     SOCIAL HX:  Social History   Tobacco Use  . Smoking status: Former Smoker    Packs/day: 1.00    Years: 50.00    Pack years: 50.00    Types: Cigarettes    Quit  date: 09/17/2003    Years since quitting: 16.8  . Smokeless tobacco: Current User    Types: Snuff  Substance Use Topics  . Alcohol use: Yes    Alcohol/week: 5.0 standard drinks    Types: 5 Cans of beer per week    Comment: "beer"   FAMILY HX:  Family History  Problem Relation Age of Onset  . Hypertension Mother   . Hypertension Sister   . Diabetes Mellitus II Sister     ALLERGIES:  Allergies  Allergen Reactions  . Atorvastatin Nausea Only     PERTINENT MEDICATIONS:  Outpatient Encounter Medications as of 07/06/2020  Medication Sig  . aspirin EC 81 MG tablet Take 81 mg by mouth daily. Swallow whole.  Marland Kitchen atorvastatin (LIPITOR) 40 MG tablet Take 40 mg by mouth daily.  . [DISCONTINUED] amLODipine (NORVASC) 10 MG tablet Take by mouth. (Patient not taking: Reported on 06/30/2020)  . [DISCONTINUED] clopidogrel (PLAVIX) 75 MG tablet Take 75 mg by mouth daily.  . [DISCONTINUED] fluticasone (FLONASE) 50 MCG/ACT nasal spray Place 2 sprays into both nostrils daily.  . [DISCONTINUED] pravastatin (PRAVACHOL) 40 MG tablet Take by mouth.   No facility-administered encounter medications on file as of 07/06/2020.     Jason Coop, NP , DNP, MPH, AGPCNP-BC, ACHPN  COVID-19 PATIENT SCREENING TOOL  Person answering questions: ____________daughter______ _____   1.  Is the patient or any family member in the home showing any signs or symptoms regarding respiratory infection?               Person with Symptom- __________NA_________________  a. Fever                                                                          Yes___ No___          ___________________  b. Shortness of breath                                                    Yes___ No___          ___________________ c. Cough/congestion  Yes___  No___         ___________________ d. Body aches/pains                                                         Yes___ No___         ____________________ e. Gastrointestinal symptoms (diarrhea, nausea)           Yes___ No___        ____________________  2. Within the past 14 days, has anyone living in the home had any contact with someone with or under investigation for COVID-19?    Yes___ No_X_   Person __________________

## 2020-10-17 ENCOUNTER — Encounter: Payer: Self-pay | Admitting: Internal Medicine

## 2020-10-17 ENCOUNTER — Other Ambulatory Visit: Payer: Self-pay

## 2020-10-17 ENCOUNTER — Emergency Department

## 2020-10-17 ENCOUNTER — Observation Stay
Admission: EM | Admit: 2020-10-17 | Discharge: 2020-10-18 | Disposition: A | Discharge status: Home / Self Care | Attending: Internal Medicine | Admitting: Internal Medicine

## 2020-10-17 ENCOUNTER — Encounter: Payer: Self-pay | Admitting: Emergency Medicine

## 2020-10-17 ENCOUNTER — Emergency Department
Admission: EM | Admit: 2020-10-17 | Discharge: 2020-10-17 | Disposition: A | Discharge status: Home / Self Care | Source: Home / Self Care

## 2020-10-17 DIAGNOSIS — Z85818 Personal history of malignant neoplasm of other sites of lip, oral cavity, and pharynx: Secondary | ICD-10-CM | POA: Diagnosis not present

## 2020-10-17 DIAGNOSIS — R778 Other specified abnormalities of plasma proteins: Secondary | ICD-10-CM | POA: Diagnosis not present

## 2020-10-17 DIAGNOSIS — Z5321 Procedure and treatment not carried out due to patient leaving prior to being seen by health care provider: Secondary | ICD-10-CM | POA: Insufficient documentation

## 2020-10-17 DIAGNOSIS — Z66 Do not resuscitate: Secondary | ICD-10-CM | POA: Diagnosis not present

## 2020-10-17 DIAGNOSIS — Z8249 Family history of ischemic heart disease and other diseases of the circulatory system: Secondary | ICD-10-CM | POA: Insufficient documentation

## 2020-10-17 DIAGNOSIS — E785 Hyperlipidemia, unspecified: Secondary | ICD-10-CM | POA: Diagnosis present

## 2020-10-17 DIAGNOSIS — Z20822 Contact with and (suspected) exposure to covid-19: Secondary | ICD-10-CM | POA: Insufficient documentation

## 2020-10-17 DIAGNOSIS — Z79899 Other long term (current) drug therapy: Secondary | ICD-10-CM | POA: Diagnosis not present

## 2020-10-17 DIAGNOSIS — I2510 Atherosclerotic heart disease of native coronary artery without angina pectoris: Secondary | ICD-10-CM | POA: Insufficient documentation

## 2020-10-17 DIAGNOSIS — Z87891 Personal history of nicotine dependence: Secondary | ICD-10-CM | POA: Insufficient documentation

## 2020-10-17 DIAGNOSIS — R0602 Shortness of breath: Secondary | ICD-10-CM | POA: Insufficient documentation

## 2020-10-17 DIAGNOSIS — I10 Essential (primary) hypertension: Secondary | ICD-10-CM | POA: Diagnosis present

## 2020-10-17 DIAGNOSIS — H9193 Unspecified hearing loss, bilateral: Secondary | ICD-10-CM | POA: Diagnosis not present

## 2020-10-17 DIAGNOSIS — J90 Pleural effusion, not elsewhere classified: Secondary | ICD-10-CM | POA: Diagnosis not present

## 2020-10-17 DIAGNOSIS — Z9889 Other specified postprocedural states: Secondary | ICD-10-CM

## 2020-10-17 DIAGNOSIS — J9621 Acute and chronic respiratory failure with hypoxia: Secondary | ICD-10-CM | POA: Diagnosis present

## 2020-10-17 DIAGNOSIS — C3490 Malignant neoplasm of unspecified part of unspecified bronchus or lung: Secondary | ICD-10-CM | POA: Diagnosis not present

## 2020-10-17 DIAGNOSIS — Z85118 Personal history of other malignant neoplasm of bronchus and lung: Secondary | ICD-10-CM | POA: Insufficient documentation

## 2020-10-17 DIAGNOSIS — C3411 Malignant neoplasm of upper lobe, right bronchus or lung: Secondary | ICD-10-CM | POA: Diagnosis present

## 2020-10-17 DIAGNOSIS — Z7982 Long term (current) use of aspirin: Secondary | ICD-10-CM | POA: Insufficient documentation

## 2020-10-17 DIAGNOSIS — J9601 Acute respiratory failure with hypoxia: Secondary | ICD-10-CM

## 2020-10-17 DIAGNOSIS — R7989 Other specified abnormal findings of blood chemistry: Secondary | ICD-10-CM | POA: Diagnosis present

## 2020-10-17 LAB — CBC WITH DIFFERENTIAL/PLATELET
Abs Immature Granulocytes: 0.1 10*3/uL — ABNORMAL HIGH (ref 0.00–0.07)
Basophils Absolute: 0 10*3/uL (ref 0.0–0.1)
Basophils Relative: 0 %
Eosinophils Absolute: 0 10*3/uL (ref 0.0–0.5)
Eosinophils Relative: 0 %
HCT: 47.8 % — ABNORMAL HIGH (ref 36.0–46.0)
Hemoglobin: 15 g/dL (ref 12.0–15.0)
Immature Granulocytes: 1 %
Lymphocytes Relative: 8 %
Lymphs Abs: 0.8 10*3/uL (ref 0.7–4.0)
MCH: 26.6 pg (ref 26.0–34.0)
MCHC: 31.4 g/dL (ref 30.0–36.0)
MCV: 84.9 fL (ref 80.0–100.0)
Monocytes Absolute: 0.3 10*3/uL (ref 0.1–1.0)
Monocytes Relative: 3 %
Neutro Abs: 8.7 10*3/uL — ABNORMAL HIGH (ref 1.7–7.7)
Neutrophils Relative %: 88 %
Platelets: 204 10*3/uL (ref 150–400)
RBC: 5.63 MIL/uL — ABNORMAL HIGH (ref 3.87–5.11)
RDW: 18.8 % — ABNORMAL HIGH (ref 11.5–15.5)
WBC: 10 10*3/uL (ref 4.0–10.5)
nRBC: 0 % (ref 0.0–0.2)

## 2020-10-17 LAB — COMPREHENSIVE METABOLIC PANEL
ALT: 17 U/L (ref 0–44)
AST: 29 U/L (ref 15–41)
Albumin: 2.9 g/dL — ABNORMAL LOW (ref 3.5–5.0)
Alkaline Phosphatase: 67 U/L (ref 38–126)
Anion gap: 14 (ref 5–15)
BUN: 42 mg/dL — ABNORMAL HIGH (ref 8–23)
CO2: 28 mmol/L (ref 22–32)
Calcium: 9.2 mg/dL (ref 8.9–10.3)
Chloride: 102 mmol/L (ref 98–111)
Creatinine, Ser: 1.1 mg/dL — ABNORMAL HIGH (ref 0.44–1.00)
GFR, Estimated: 47 mL/min — ABNORMAL LOW (ref >60)
Glucose, Bld: 192 mg/dL — ABNORMAL HIGH (ref 70–99)
Potassium: 3.8 mmol/L (ref 3.5–5.1)
Sodium: 144 mmol/L (ref 135–145)
Total Bilirubin: 1.1 mg/dL (ref 0.3–1.2)
Total Protein: 8.8 g/dL — ABNORMAL HIGH (ref 6.5–8.1)

## 2020-10-17 LAB — TROPONIN I (HIGH SENSITIVITY)
Troponin I (High Sensitivity): 497 ng/L (ref <18)
Troponin I (High Sensitivity): 345 ng/L (ref <18)
Troponin I (High Sensitivity): 971 ng/L (ref <18)

## 2020-10-17 LAB — BRAIN NATRIURETIC PEPTIDE: B Natriuretic Peptide: 195.5 pg/mL — ABNORMAL HIGH (ref 0.0–100.0)

## 2020-10-17 LAB — APTT: aPTT: 30 seconds (ref 24–36)

## 2020-10-17 LAB — SARS CORONAVIRUS 2 (TAT 6-24 HRS): SARS Coronavirus 2: NEGATIVE

## 2020-10-17 LAB — PROTIME-INR
INR: 1.3 — ABNORMAL HIGH (ref 0.8–1.2)
Prothrombin Time: 15.4 seconds — ABNORMAL HIGH (ref 11.4–15.2)

## 2020-10-17 MED ORDER — ALBUTEROL SULFATE HFA 108 (90 BASE) MCG/ACT IN AERS
2.0000 | INHALATION_SPRAY | RESPIRATORY_TRACT | Status: DC | PRN
  Filled 2020-10-17: qty 6.7

## 2020-10-17 MED ORDER — MORPHINE SULFATE (CONCENTRATE) 10 MG/0.5ML PO SOLN
10.0000 mg | ORAL | Status: DC | PRN
  Administered 2020-10-17: 10 mg via ORAL
  Filled 2020-10-17: qty 0.5

## 2020-10-17 MED ORDER — SIMVASTATIN 40 MG PO TABS
40.0000 mg | ORAL_TABLET | Freq: Every day | ORAL | Status: DC
  Filled 2020-10-17: qty 1

## 2020-10-17 MED ORDER — ASPIRIN EC 81 MG PO TBEC
81.0000 mg | DELAYED_RELEASE_TABLET | Freq: Every day | ORAL | Status: DC
  Filled 2020-10-17: qty 1

## 2020-10-17 MED ORDER — LIDOCAINE HCL (PF) 1 % IJ SOLN
INTRAMUSCULAR | Status: AC
  Filled 2020-10-17: qty 5

## 2020-10-17 MED ORDER — DM-GUAIFENESIN ER 30-600 MG PO TB12: 1.0000 | ORAL_TABLET | Freq: Two times a day (BID) | ORAL | Status: DC | PRN

## 2020-10-17 MED ORDER — ACETAMINOPHEN 325 MG PO TABS
650.0000 mg | ORAL_TABLET | Freq: Four times a day (QID) | ORAL | Status: DC | PRN
  Administered 2020-10-17: 650 mg via ORAL
  Filled 2020-10-17: qty 2

## 2020-10-17 MED ORDER — ASPIRIN 81 MG PO CHEW
324.0000 mg | CHEWABLE_TABLET | Freq: Once | ORAL | Status: AC
  Administered 2020-10-17: 324 mg via ORAL
  Filled 2020-10-17: qty 4

## 2020-10-17 NOTE — Progress Notes (Signed)
[facility]  [facility]) Hospital Liaison Hospitalized Hospice patient RN note:  [name] is a current hospice patient with a terminal diagnosis of colon/lung cancer. Family brought patient to the [facility] ED after increasing shortness of breath and use of oxygen over the last few days.Patient was unable to sleep last night due to severe shortness of breath.  Family contacted hospice RN Case manager with plan to go to hospital.  Patient was admitted to [facility] on 02.01 with a diagnosis of right pleural effusion and elevated troponin. Per Dr. Gilford Rile with [facility], this is a related admission. Patient is a DNR.  Visited patient in the ED at bedside.She is resting in bed. Awake and alert. Has O2 at 3L. Used PRN at home. Spoke with daughter, [name] at bedside. Reports that she has not been able to sleep last night due to her mother's breathing. Emotional support provided. Report exchanged with hospital care team. Spoke with [name] , RN. He reports that she has complained of back pain and received Tylenol. Spoke with  Dr. Quentin Cornwall. Plan is to admit to floor with planned thoracentesis.   Work-up showed evidence of development of large right pleural effusion which is likely causing the patient's acute hypoxia. Troponin was repeated and  is elevated but downtrending. Patient does not complain of any chest pain. She is very HOH and simply states she would like to go home.  Plan is for a thoracentesis once COVID test results have returned.  Vital Signs: BP 108/67; HR 94; Resp 21; temp 97.7, O2 sats 96% on 3 liters   I&O: none charted  Abnormal labs: Glucose: 192 (H) BUN: 42 (H) Creatinine: 1.10 (H) Albumin: 2.9 (L) Total Protein: 8.8 (H) GFR, Estimated: 47 (L) Troponin I (High Sensitivity): 497 (HH) RBC: 5.63 (H) HCT: 47.8 (H) RDW: 18.8 (H) NEUT#: 8.7 (H) Abs Immature Granulocytes: 0.10 (H) Natriuretic Peptide: 195.5 (H) Troponin I (High Sensitivity): 345  (HH)  Diagnostics: CXR FINDINGS: Large right pleural effusion obscuring the right lower lung. There is asymmetric reticulation and pulmonary density at the right upper lobe, where there was mass seen on comparison. Normal heart size. Aortic tortuosity. Emphysema.  IMPRESSION: 1. Large right pleural effusion obscuring much of the right lung. 2. COPD.  IV/PRN Meds: acetaminophen (TYLENOL) tablet 650 mg Dose: 650 mg Freq: Every 6 hours PRN Route: PO   Problem list:  Pleural effusion on right 10/17/2020   HLD (hyperlipidemia) 10/17/2020   Acute on chronic respiratory failure with hypoxia ([place]) 10/17/2020   Elevated troponin 10/17/2020   Primary cancer of right upper lobe of lung ([place]) 06/30/2020   Bilateral edema of lower extremity 01/10/2016   Absolute anemia 10/20/2015   Arthritis 10/20/2015   Cataract cortical, senile 10/20/2015   Cardiovascular degeneration (with mention of arteriosclerosis) 10/20/2015   Difficulty hearing 10/20/2015   BP (high blood pressure) 10/20/2015   History of colon polyps 10/20/2015   Malignant neoplasm of pharynx ([place]) 10/20/2015   Discharge Planning: Plan to discharge back home with daughter once procedure completed.  Family Contact: Spoke with daughter, [name] at bedside. No concerns  IDG: Updated  Goals of Care: Clear. Thoracentesis for palliative measures. Possible pleurx cath in the future.   Medication list and Transfer Summary placed on [facility] chart.  Please call with any hospice related questions or concerns.  [facility] Liaison  [phone]

## 2020-10-17 NOTE — ED Provider Notes (Signed)
-----------------------------------------   6:06 AM on 10/17/2020 -----------------------------------------  Noted large right pleural effusion and elevated troponin. Unfortunately patient and daughter left without being seen. I have called and personally spoken with patient's daughter to encourage her to return to the ED for evaluation and likely hospitalization. Daughter verbalizes and agrees.   Paulette Blanch, MD 10/17/20 [phone]

## 2020-10-17 NOTE — H&P (Signed)
History and Physical    [name] FBP:[id number] DOB: Sep 08, 1925 DOA: 10/17/2020  Referring MD/NP/PA:   PCP: [name], MD   Patient coming from:  The patient is coming from home.   Chief Complaint: SOB  HPI: [name] is a 87 y.o. female with medical history significant of hypertension, hyperlipidemia, throat cancer (s/p of XRT 2004), polycythemia, hard of hearing, lung cancer (not being treated, under hospice care), who presents with shortness breath.  Patient very poor historian due to severe hearing loss bilaterally. Much of the history is obtained from the patient's daughter at bedside. Patient is using prn oxygen at baseline, but her shortness breath has been progressively worsening in the past several days.  She has cough obese clear mucus production.  No chest pain, fever or chills.  She is unable to sleep last night due to severe shortness of breath.  Denies nausea, vomiting, diarrhea, abdominal pain, symptoms of UTI.  No unilateral numbness or tingling's. Patient was found to have oxygen desaturation to 86% on room air, started 3 L oxygen with 98% saturation in ED.   ED Course: pt was found to have WBC 10.0, troponin level 497, 345, BNP 195, pending Covid PCR, creatinine 1.01, BUN 42, temperature normal, blood pressure 108/69, heart rate 101, 88, RR 27, 16.  Chest x-ray showed large right pleural effusion.  Patient is placed on MedSurg bed of observation.  Review of Systems:   General: no fevers, chills, no body weight gain, has fatigue HEENT: no blurry vision, hearing changes or sore throat Respiratory: has dyspnea, coughing, no wheezing CV: no chest pain, no palpitations GI: no nausea, vomiting, abdominal pain, diarrhea, constipation GU: no dysuria, burning on urination, increased urinary frequency, hematuria  Ext: no leg edema Neuro: no unilateral weakness, numbness, or tingling, no vision change or hearing loss Skin: no rash, no skin tear. MSK: No muscle  spasm, no deformity, no limitation of range of movement in spin Heme: No easy bruising.  Travel history: No recent long distant travel.  Allergy:  Allergies  Allergen Reactions  . Atorvastatin Nausea Only    Past Medical History:  Diagnosis Date  . Anemia   . Arthritis   . Cataract cortical, senile   . CVD (cardiovascular disease)   . Hearing loss of both ears    wears hearing aides  . History of colonic polyps   . Hypertension   . Polycythemia   . Throat cancer ([place]) 2005  . Unsteady gait     Past Surgical History:  Procedure Laterality Date  . JOINT REPLACEMENT Left    11-12 years ago secondary to some from children    Social History:  reports that she quit smoking about 17 years ago. Her smoking use included cigarettes. She has a 50.00 pack-year smoking history. Her smokeless tobacco use includes snuff. She reports current alcohol use of about 5.0 standard drinks of alcohol per week. She reports that she does not use drugs.  Family History:  Family History  Problem Relation Age of Onset  . Hypertension Mother   . Hypertension Sister   . Diabetes Mellitus II Sister      Prior to Admission medications   Medication Sig Start Date End Date Taking? Authorizing Provider  aspirin EC 81 MG tablet Take 81 mg by mouth daily. Swallow whole.    [provider]  atorvastatin (LIPITOR) 40 MG tablet Take 40 mg by mouth daily.    [provider]    Physical  Exam: Vitals:   10/17/20 0800 10/17/20 0815 10/17/20 0830 10/17/20 0930  BP:   108/69 122/79  Pulse: 96 100 88 96  Resp: (!) 27 (!) 25 16 (!) 21  Temp:      TempSrc:      SpO2: 94% 99% 100% 98%  Weight:      Height:       General: Not in acute distress HEENT:       Eyes: PERRL, EOMI, no scleral icterus.       ENT: No discharge from the ears and nose       Neck: No JVD, no bruit, no mass felt. Heme: No neck lymph node enlargement. Cardiac: S1/S2, RRR, No murmurs, No gallops or  rubs. Respiratory: Decreased air movement on the right side GI: Soft, nondistended, nontender, no rebound pain, no organomegaly, BS present. GU: No hematuria Ext: No pitting leg edema bilaterally. 1+DP/PT pulse bilaterally. Musculoskeletal: No joint deformities, No joint redness or warmth, no limitation of ROM in spin. Skin: No rashes.  Neuro: Alert, very poor hearing, oriented X3, cranial nerves II-XII grossly intact, moves all extremities normally. Psych: Patient is not psychotic, no suicidal or hemocidal ideation.  Labs on Admission: I have personally reviewed following labs and imaging studies  CBC: Recent Labs  Lab 10/17/20 0400  WBC 10.0  NEUTROABS 8.7*  HGB 15.0  HCT 47.8*  MCV 84.9  PLT 106   Basic Metabolic Panel: Recent Labs  Lab 10/17/20 0400  NA 144  K 3.8  CL 102  CO2 28  GLUCOSE 192*  BUN 42*  CREATININE 1.10*  CALCIUM 9.2   GFR: Estimated Creatinine Clearance: 23.2 mL/min (A) (by C-G formula based on SCr of 1.1 mg/dL (H)). Liver Function Tests: Recent Labs  Lab 10/17/20 0400  AST 29  ALT 17  ALKPHOS 67  BILITOT 1.1  PROT 8.8*  ALBUMIN 2.9*   No results for input(s): LIPASE, AMYLASE in the last 168 hours. No results for input(s): AMMONIA in the last 168 hours. Coagulation Profile: No results for input(s): INR, PROTIME in the last 168 hours. Cardiac Enzymes: No results for input(s): CKTOTAL, CKMB, CKMBINDEX, TROPONINI in the last 168 hours. BNP (last 3 results) No results for input(s): PROBNP in the last 8760 hours. HbA1C: No results for input(s): HGBA1C in the last 72 hours. CBG: No results for input(s): GLUCAP in the last 168 hours. Lipid Profile: No results for input(s): CHOL, HDL, LDLCALC, TRIG, CHOLHDL, LDLDIRECT in the last 72 hours. Thyroid Function Tests: No results for input(s): TSH, T4TOTAL, FREET4, T3FREE, THYROIDAB in the last 72 hours. Anemia Panel: No results for input(s): VITAMINB12, FOLATE, FERRITIN, TIBC, IRON, RETICCTPCT  in the last 72 hours. Urine analysis:    Component Value Date/Time   COLORURINE YELLOW (A) 07/05/2020 0940   APPEARANCEUR CLOUDY (A) 07/05/2020 0940   LABSPEC 1.018 07/05/2020 0940   PHURINE 7.0 07/05/2020 0940   GLUCOSEU NEGATIVE 07/05/2020 0940   HGBUR NEGATIVE 07/05/2020 0940   BILIRUBINUR NEGATIVE 07/05/2020 0940   KETONESUR NEGATIVE 07/05/2020 0940   PROTEINUR NEGATIVE 07/05/2020 0940   NITRITE NEGATIVE 07/05/2020 0940   LEUKOCYTESUR NEGATIVE 07/05/2020 0940   Sepsis Labs: @LABRCNTIP (procalcitonin:4,lacticidven:4) )No results found for this or any previous visit (from the past 240 hour(s)).   Radiological Exams on Admission: DG Chest 2 View  Result Date: 10/17/2020 CLINICAL DATA:  Shortness of breath.  Lung cancer EXAM: CHEST - 2 VIEW COMPARISON:  04/16/2017 chest x-ray. Chest CT 06/16/2020 from [facility] hospital FINDINGS: Large right pleural  effusion obscuring the right lower lung. There is asymmetric reticulation and pulmonary density at the right upper lobe, where there was mass seen on comparison. Normal heart size. Aortic tortuosity. Emphysema. IMPRESSION: 1. Large right pleural effusion obscuring much of the right lung. 2. COPD. Electronically Signed   By: Monte Fantasia M.D.   On: 10/17/2020 04:54     EKG: I have personally reviewed.  Poor quality of EKG strips, LAD, QTC 567?  Assessment/Plan Principal Problem:   Pleural effusion on right Active Problems:   BP (high blood pressure)   Primary cancer of right upper lobe of lung ([facility])   HLD (hyperlipidemia)   Acute on chronic respiratory failure with hypoxia ([facility])   Elevated troponin   Acute on chronic respiratory failure with hypoxia due to pleural effusion on right: Patient's worsening shortness breath is most likely due to large pleural effusion on the right side, which is likely malignant pleural effusion secondary to lung cancer. Dr. Dwaine Gale of IR is consulted for thoracentesis  -Placed on MedSurg bed of  observation -As needed albuterol and Mucinex -Deep suction -Follow-up fluid analysis  BP (high blood pressure): Blood pressure 108/69.  Patient is not taking medications currently -Monitor blood pressure closely  Primary cancer of right upper lobe of lung [facility]): Per her daughter, patient does not want to be treated -Under hospice care -continue home morphine concentrate for pain as needed  Elevated troponin: trop 497 -->345. No CP.  Possibly due to demand ischemia.  I discussed with her daughter about her choice of treatment.  Daughter want patient to be treated medically.  No invasive diagnostic or test should be done, such as cardiac catheterization. -Aspirin -Add Zocor -Trend troponin -Check A1c, FLP  HLD (hyperlipidemia) -added zocor     DVT ppx: SCD Code Status: DNR per her daughtger Family Communication:   Yes, patient's daughter at bed side Disposition Plan:  Anticipate discharge back to previous environment Consults called:  Dr. Dwaine Gale of IR Admission status and Level of care: Med-Surg obs    Status is: Observation  The patient remains OBS appropriate and will d/c before 2 midnights.  Dispo: The patient is from: Home              Anticipated d/c is to: Home              Anticipated d/c date is: 1 day              Patient currently is not medically stable to d/c.   Difficult to place patient No           Date of Service 10/17/2020    Ivor Costa [facility]   If 7PM-7AM, please contact night-coverage [email or web address] 10/17/2020, 10:37 AM

## 2020-10-17 NOTE — ED Triage Notes (Signed)
[facility] Emergency Department Provider Note    Event Date/Time   First MD Initiated Contact with Patient 10/17/20 0701     (approximate)  I have reviewed the triage vital signs and the nursing notes.   HISTORY  Chief Complaint Shortness of Breath    HPI ANALLELY [name] is a 87 y.o. female with an extensive past medical history presents to the ER for worsening shortness of breath.  Patient very poor historian due to significant hearing difficulty.  Much of the story is obtained from the patient's daughter at bedside.  Reportedly is on hospice care and has uses oxygen intermittently but has had worsening shortness of breath of the past few days.  She is unable to sleep last night due to severe shortness of breath.  No report of any fevers or pain.    Past Medical History:  Diagnosis Date  . Anemia   . Arthritis   . Cataract cortical, senile   . CVD (cardiovascular disease)   . Hearing loss of both ears    wears hearing aides  . History of colonic polyps   . Hypertension   . Polycythemia   . Throat cancer ([place]) 2005  . Unsteady gait    Family History  Problem Relation Age of Onset  . Hypertension Mother   . Hypertension Sister   . Diabetes Mellitus II Sister    Past Surgical History:  Procedure Laterality Date  . JOINT REPLACEMENT Left    11-12 years ago secondary to some from children   Patient Active Problem List   Diagnosis Date Noted  . Pleural effusion on right 10/17/2020  . HLD (hyperlipidemia) 10/17/2020  . Acute on chronic respiratory failure with hypoxia ([place]) 10/17/2020  . Elevated troponin 10/17/2020  . Primary cancer of right upper lobe of lung ([place]) 06/30/2020  . Bilateral edema of lower extremity 01/10/2016  . Absolute anemia 10/20/2015  . Arthritis 10/20/2015  . Cataract cortical, senile 10/20/2015  . 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 Difficulty hearing 10/20/2015  . BP (high  blood pressure) 10/20/2015  . History of colon polyps 10/20/2015  . Malignant neoplasm of pharynx ([place]) 10/20/2015      Prior to Admission medications   Medication Sig Start Date End Date Taking? Authorizing Provider  aspirin EC 81 MG tablet Take 81 mg by mouth daily. Swallow whole.   Yes [provider]  Morphine Sulfate (MORPHINE CONCENTRATE) 10 mg / 0.5 ml concentrated solution Take 10 mg by mouth every 4 (four) hours as needed for severe pain.   Yes [provider]    Allergies Atorvastatin    Social History Social History   Tobacco Use  . Smoking status: Former Smoker    Packs/day: 1.00    Years: 50.00    Pack years: 50.00    Types: Cigarettes    Quit date: 09/17/2003    Years since quitting: 17.0  . Smokeless tobacco: Current User    Types: Snuff  Substance Use Topics  . Alcohol use: Yes    Alcohol/week: 5.0 standard drinks    Types: 5 Cans of beer per week    Comment: "beer"  . Drug use: No    Review of Systems Patient denies headaches, rhinorrhea, blurry vision, numbness, shortness of breath, chest pain, edema, cough, abdominal pain, nausea, vomiting, diarrhea, dysuria, fevers, rashes or hallucinations unless otherwise stated above in HPI. ____________________________________________   PHYSICAL EXAM:  VITAL SIGNS: Vitals:   10/17/20 1030  10/17/20 1045  BP: 109/66   Pulse:  96  Resp:    Temp:    SpO2:  99%    Constitutional: Alert and oriented.  Eyes: Conjunctivae are normal.  Head: Atraumatic. Nose: No congestion/rhinnorhea. Mouth/Throat: Mucous membranes are moist.   Neck: No stridor. Painless ROM.  Cardiovascular: Normal rate, regular rhythm. Grossly normal heart sounds.  Good peripheral circulation. Respiratory: mild tachypnea, diminished right sided bs Gastrointestinal: Soft and nontender. No distention. No abdominal bruits. No CVA tenderness. Genitourinary: deferred Musculoskeletal: No lower extremity tenderness nor edema.   No joint effusions. Neurologic:  Normal speech and language. No gross focal neurologic deficits are appreciated. No facial droop Skin:  Skin is warm, dry and intact. No rash noted. Psychiatric: Mood and affect are normal. Speech and behavior are normal.  ____________________________________________   LABS (all labs ordered are listed, but only abnormal results are displayed)  Results for orders placed or performed during the hospital encounter of 10/17/20 (from the past 24 hour(s))  Troponin I (High Sensitivity)     Status: Abnormal   Collection Time: 10/17/20  8:19 AM  Result Value Ref Range   Troponin I (High Sensitivity) 345 (HH) <18 ng/L  Brain natriuretic peptide     Status: Abnormal   Collection Time: 10/17/20  8:19 AM  Result Value Ref Range   B Natriuretic Peptide 195.5 (H) 0.0 - 100.0 pg/mL   ____________________________________________  EKG My review and personal interpretation at Time: 3:58   Indication: sob  Rate: 140  Rhythm: sinus Axis: unable to determine Other: abnml ekg, significant motion artifact ____________________________________________  RADIOLOGY  I personally reviewed all radiographic images ordered to evaluate for the above acute complaints and reviewed radiology reports and findings.  These findings were personally discussed with the patient.  Please see medical record for radiology report.  ____________________________________________   PROCEDURES  Procedure(s) performed:  .Critical Care Performed by: Merlyn Lot, MD Authorized by: Merlyn Lot, MD   Critical care provider statement:    Critical care time (minutes):  15   Critical care time was exclusive of:  Separately billable procedures and treating other patients   Critical care was necessary to treat or prevent imminent or life-threatening deterioration of the following conditions:  Respiratory failure   Critical care was time spent personally by me on the following  activities:  Development of treatment plan with patient or surrogate, discussions with consultants, evaluation of patient's response to treatment, examination of patient, obtaining history from patient or surrogate, ordering and performing treatments and interventions, ordering and review of laboratory studies, ordering and review of radiographic studies, pulse oximetry, re-evaluation of patient's condition and review of old charts      Critical Care performed: no ____________________________________________   INITIAL IMPRESSION / [place] / ED COURSE  Pertinent labs & imaging results that were available during my care of the patient were reviewed by me and considered in my medical decision making (see chart for details).   DDX: Asthma, copd, CHF, pna, ptx, malignancy, Pe, anemia   Delma V Kerrick is a 87 y.o. who presents to the ED with worsening shortness of breath presentation as described above.  Work-up shows evidence of interval development of large right pleural effusion which is likely causing the patient's acute hypoxia.  Troponin repeated is elevated but downtrending. .  She does not complain of any chest pain.  I do suspect this is demand ischemia in the setting of her hypoxia and pleural effusion.  BNP not significantly  elevated.  Have a lower suspicion for acute heart failure or PE.  Will hold off on heparinization at th is time given plan for thoracentesis and her goals of care are palliative. Patient is DNR but would like evaluation for thoracentesis to help with her dyspnea..  Will discuss with hospitalist for admission.     The patient was evaluated in Emergency Department today for the symptoms described in the history of present illness. He/she was evaluated in the context of the global COVID-19 pandemic, which necessitated consideration that the patient might be at risk for infection with the SARS-CoV-2 virus that causes COVID-19. Institutional protocols and  algorithms that pertain to the evaluation of patients at risk for COVID-19 are in a state of rapid change based on information released by regulatory bodies including the CDC and federal and state organizations. These policies and algorithms were followed during the patient's care in the ED.  As part of my medical decision making, I reviewed the following data within the [place] notes reviewed and incorporated, Labs reviewed, notes from prior ED visits and [place] Controlled Substance Database   ____________________________________________   FINAL CLINICAL IMPRESSION(S) / ED DIAGNOSES  Final diagnoses:  Pleural effusion  Acute respiratory failure with hypoxia ([place])      NEW MEDICATIONS STARTED DURING THIS VISIT:  New Prescriptions   No medications on file     Note:  This document was prepared using Dragon voice recognition software and may include unintentional dictation errors.    Merlyn Lot, MD 10/17/20 1153

## 2020-10-17 NOTE — Progress Notes (Signed)
IR aware of thoracentesis request. Will proceed once COVID test results have returned.  Please call IR with any questions.  Soyla Dryer, [place] [phone] 10/17/2020, 12:18 PM

## 2020-10-17 NOTE — ED Triage Notes (Signed)
Pt arrives EMS for shortness of breath and abnormal labs. Pt was seen earlier this morning and left. Pt came back after Dr called and said she needed to be admitted based off of her lab work. EMS placed pt on 3 L [place] for RA sats around 86%. Pts daughter states that she has O2 at home but uses it on a PRN basis.

## 2020-10-17 NOTE — ED Triage Notes (Addendum)
EMS brings pt in from home; Stage 3 lung CA, under hospice care, no active treatment; c/o increasing SHOB, sats 90's, O2 at 3l/min via [place] as needed; pt is very HOH, accomp by daughter

## 2020-10-18 ENCOUNTER — Observation Stay

## 2020-10-18 DIAGNOSIS — J90 Pleural effusion, not elsewhere classified: Secondary | ICD-10-CM | POA: Diagnosis not present

## 2020-10-18 LAB — CBC
HCT: 44.8 % (ref 36.0–46.0)
Hemoglobin: 14.4 g/dL (ref 12.0–15.0)
MCH: 27.2 pg (ref 26.0–34.0)
MCHC: 32.1 g/dL (ref 30.0–36.0)
MCV: 84.7 fL (ref 80.0–100.0)
Platelets: 168 10*3/uL (ref 150–400)
RBC: 5.29 MIL/uL — ABNORMAL HIGH (ref 3.87–5.11)
RDW: 18.9 % — ABNORMAL HIGH (ref 11.5–15.5)
WBC: 14 10*3/uL — ABNORMAL HIGH (ref 4.0–10.5)
nRBC: 0 % (ref 0.0–0.2)

## 2020-10-18 LAB — BODY FLUID CELL COUNT WITH DIFFERENTIAL
Eos, Fluid: 0 %
Lymphs, Fluid: 88 %
Monocyte-Macrophage-Serous Fluid: 3 %
Neutrophil Count, Fluid: 9 %
Other Cells, Fluid: 0 %
Total Nucleated Cell Count, Fluid: 1479 cu mm

## 2020-10-18 LAB — HEMOGLOBIN A1C
Hgb A1c MFr Bld: 5.8 % — ABNORMAL HIGH (ref 4.8–5.6)
Mean Plasma Glucose: 119.76 mg/dL

## 2020-10-18 LAB — BASIC METABOLIC PANEL
Anion gap: 11 (ref 5–15)
BUN: 48 mg/dL — ABNORMAL HIGH (ref 8–23)
CO2: 28 mmol/L (ref 22–32)
Calcium: 8.7 mg/dL — ABNORMAL LOW (ref 8.9–10.3)
Chloride: 105 mmol/L (ref 98–111)
Creatinine, Ser: 0.99 mg/dL (ref 0.44–1.00)
GFR, Estimated: 53 mL/min — ABNORMAL LOW (ref >60)
Glucose, Bld: 104 mg/dL — ABNORMAL HIGH (ref 70–99)
Potassium: 3.7 mmol/L (ref 3.5–5.1)
Sodium: 144 mmol/L (ref 135–145)

## 2020-10-18 LAB — LIPID PANEL
Cholesterol: 154 mg/dL (ref 0–200)
HDL: 43 mg/dL (ref >40)
LDL Cholesterol: 90 mg/dL (ref 0–99)
Total CHOL/HDL Ratio: 3.6 RATIO
Triglycerides: 104 mg/dL (ref <150)
VLDL: 21 mg/dL (ref 0–40)

## 2020-10-18 LAB — GLUCOSE, PLEURAL OR PERITONEAL FLUID: Glucose, Fluid: 71 mg/dL

## 2020-10-18 LAB — GLUCOSE, CAPILLARY: Glucose-Capillary: 76 mg/dL (ref 70–99)

## 2020-10-18 LAB — PROTEIN, PLEURAL OR PERITONEAL FLUID: Total protein, fluid: 6.5 g/dL

## 2020-10-18 LAB — PATHOLOGIST SMEAR REVIEW

## 2020-10-18 MED ORDER — ALBUTEROL SULFATE HFA 108 (90 BASE) MCG/ACT IN AERS: 2.0000 | INHALATION_SPRAY | RESPIRATORY_TRACT | Status: DC | PRN

## 2020-10-18 MED ORDER — COLLAGENASE 250 UNIT/GM EX OINT
TOPICAL_OINTMENT | Freq: Every day | CUTANEOUS | Status: DC
  Filled 2020-10-18: qty 30

## 2020-10-18 MED ORDER — ACETAMINOPHEN 325 MG PO TABS: 650.0000 mg | ORAL_TABLET | Freq: Four times a day (QID) | ORAL | Status: DC | PRN

## 2020-10-18 MED ORDER — COLLAGENASE 250 UNIT/GM EX OINT: TOPICAL_OINTMENT | Freq: Every day | CUTANEOUS | 0 refills | Status: DC

## 2020-10-18 MED ORDER — DM-GUAIFENESIN ER 30-600 MG PO TB12: 1.0000 | ORAL_TABLET | Freq: Two times a day (BID) | ORAL | Status: DC | PRN

## 2020-10-18 MED ORDER — MIDODRINE HCL 5 MG PO TABS: 2.5000 mg | ORAL_TABLET | Freq: Three times a day (TID) | ORAL | Status: DC

## 2020-10-18 NOTE — Consult Note (Addendum)
[facility] Nurse Consult Note: Reason for Consult: Consult requested for sacrum.  Pt is very emeciated with multiple systemic factors which can impair healing and has a protruding sacrum bone.   Wound type: Middle sacrum with Unstageable pressure injury; 2X2cm, 100% tightly adhered yellow slough.  On each side of the center wound are 2 other Unstageable pressure injuries, left and right sacrum each .8X.8cm, tightly adhered yellow slough, no odor or drainage. Middle back above the sacrum with stage 2 pressure injury; 1X12X.1cm, pink and dry Pressure Injury POA: Yes Dressing procedure/placement/frequency: Topical treatment orders provided for bedside nurses to perform as follows to assist with enzymatic removal of nonviable tissue and promote healing: Apply Santyl to sacrum wounds (in 3 locations) Q day, then cover with moist gauze and foam dressing.  (Change foam dressing Q 3 days or PRN soiling). Foam dressing to middle back wound to protect from further injury. Please re-consult if further assistance is needed.  Thank-you,  Julien Girt MSN, [place], [place], [place], [place]

## 2020-10-18 NOTE — TOC Transition Note (Signed)
Transition of Care (TOC) - CM/SW Discharge Note   Patient Details  Name: [name] MRN: [id number] Date of Birth: 08/14/1925  Transition of Care (TOC) CM/SW Contact:  Stephanie T Bowen, RN Phone Number: 10/18/2020, 2:53 PM   Clinical Narrative:     Patient was admitted from home with hospice.  It was determined that patient met criteria for residential hospice.  Karen/Penny from [facility] coordinated with daughter for discharge to hospice home today  Signed DNR, EMS packet on chart.  EMS transport arranged with [facility] for 4 pm        Patient Goals and CMS Choice        Discharge Placement                       Discharge Plan and Services                                     Social Determinants of Health (SDOH) Interventions     Readmission Risk Interventions No flowsheet data found.     

## 2020-10-18 NOTE — Progress Notes (Signed)
MD ordered patient to be discharged to hospice home.  [facility] RN called report to [facility] Hospice RN said to leave in the IV.  Daughter at the bedside and aware of the transfer.  Patient left via stretcher escorted by  [facility].

## 2020-10-18 NOTE — Progress Notes (Signed)
[facility]) Hospital Liaison RN note:  Visited with patient and daughter, Glenard Haring at bedside. Patient is status post thoracentesis. Remains on oxygen, lethargic, not eating. Glenard Haring reports that she has seen continued decline in her mother and is requesting her moved to the hospice home for end of life care.   Hospice team has been updated. Discharge summary has been sent to the Hospice Home. Isaias Cowman, TOC has been sent for [facility] care team is aware for above information.  Thank you for the teamwork and the opportunity to participate in this patient's care.  Zandra Abts, RN [facility] Liaison [phone]

## 2020-10-18 NOTE — Discharge Summary (Signed)
[name] XFG:[id number] DOB: 08/04/1925 DOA: 10/17/2020  PCP: [name], MD  Admit date: 10/17/2020 Discharge date: 10/18/2020  Admitted From: home with hospice Disposition:  Hospice house  Recommendations for Outpatient Follow-up:  1. Follow up with PCP in 1 week 2. Please obtain BMP/CBC in one week     Discharge Condition:Stable CODE STATUS:DNR  Diet recommendation:Regular  Brief/Interim Summary: Per HPI: [name] is a 87 y.o. female with medical history significant of hypertension, hyperlipidemia, throat cancer (s/p of XRT 2004), polycythemia, hard of hearing, lung cancer (not being treated, under hospice care), who presents with shortness breath.  Patient very poor historian due to severe hearing loss bilaterally. Much of the history was obtained from the patient's daughter at bedside. Patient is using prn oxygen at baseline, but her shortness breath has been progressively worsening in the past several days.    She is unable to sleep last night due to severe shortness of breath.  Patient was found to have oxygen desaturation to 86% on room air, started 3 L oxygen with 98% saturation in ED.Chest x-ray showed large right pleural effusion   Acute on chronic respiratory failure with hypoxia due to pleural effusion on right: Patient's worsening shortness breath is most likely due to large pleural effusion on the right side, which is likely malignant pleural effusion secondary to lung cancer.  S/p thoracentesis today Post chest x-ray no pneumothorax Now decided patient is going to hospice house  BP (high blood pressure): BP here is on the low side, continue to monitor.  She is not on any medications currently.      Primary cancer of right upper lobe of lung [facility]): Per her daughter, patient does not want to be treated Plan to go to hospice house today   Elevated troponin:  No CP.  Possibly due to demand ischemia.  Continue aspirin.   She has statins under  allergies thus will discontinue.        Discharge Diagnoses:  Principal Problem:   Pleural effusion on right Active Problems:   BP (high blood pressure)   Primary cancer of right upper lobe of lung (HCC)   HLD (hyperlipidemia)   Acute on chronic respiratory failure with hypoxia (HCC)   Elevated troponin    Discharge Instructions  Discharge Instructions    Diet - low sodium heart healthy   Complete by: As directed    Discharge wound care:   Complete by: As directed    As above   Increase activity slowly   Complete by: As directed      Allergies as of 10/18/2020      Reactions   Atorvastatin Itching, Nausea Only      Medication List    TAKE these medications   acetaminophen 325 MG tablet Commonly known as: TYLENOL Take 2 tablets (650 mg total) by mouth every 6 (six) hours as needed for fever, headache or moderate pain.   albuterol 108 (90 Base) MCG/ACT inhaler Commonly known as: VENTOLIN HFA Inhale 2 puffs into the lungs every 4 (four) hours as needed for wheezing or shortness of breath.   aspirin EC 81 MG tablet Take 81 mg by mouth daily. Swallow whole.   collagenase ointment Commonly known as: SANTYL Apply topically daily.   dextromethorphan-guaiFENesin 30-600 MG 12hr tablet Commonly known as: MUCINEX DM Take 1 tablet by mouth 2 (two) times daily as needed for cough.   morphine CONCENTRATE 10 mg / 0.5 ml concentrated solution Take 10 mg by mouth every  4 (four) hours as needed for severe pain.            Discharge Care Instructions  (From admission, onward)         Start     Ordered   10/18/20 0000  Discharge wound care:       Comments: As above   10/18/20 1406          Allergies  Allergen Reactions  . Atorvastatin Itching and Nausea Only    Consultations:  IR   Procedures/Studies: DG Chest 2 View  Result Date: 10/17/2020 CLINICAL DATA:  Shortness of breath.  Lung cancer EXAM: CHEST - 2 VIEW COMPARISON:  04/16/2017 chest x-ray.  Chest CT 06/16/2020 from [facility] hospital FINDINGS: Large right pleural effusion obscuring the right lower lung. There is asymmetric reticulation and pulmonary density at the right upper lobe, where there was mass seen on comparison. Normal heart size. Aortic tortuosity. Emphysema. IMPRESSION: 1. Large right pleural effusion obscuring much of the right lung. 2. COPD. Electronically Signed   By: Monte Fantasia M.D.   On: 10/17/2020 04:54   DG Chest Port 1 View  Result Date: 10/18/2020 CLINICAL DATA:  Post right-sided thoracentesis. EXAM: PORTABLE CHEST 1 VIEW COMPARISON:  Chest radiograph-10/17/2020 FINDINGS: Grossly unchanged cardiac silhouette and mediastinal contours with atherosclerotic plaque within a tortuous and potentially ectatic thoracic aorta. Interval reduction in persistent small to moderate-sized potentially loculated right-sided effusion post thoracentesis. No pneumothorax. Improved aeration right lung with persistent interstitial opacities involving the peripheral aspect the right mid lung. The left hemithorax scratched at the left lung remains well aerated. No acute osseous abnormalities. IMPRESSION: Interval reduction in persistent small to moderate-sized potentially loculated right-sided effusion post thoracentesis. No pneumothorax. Electronically Signed   By: Sandi Mariscal M.D.   On: 10/18/2020 13:39   US THORACENTESIS ASP PLEURAL SPACE W/IMG GUIDE  Result Date: 10/18/2020 INDICATION: Symptomatic right sided pleural effusion EXAM: US THORACENTESIS ASP PLEURAL SPACE W/IMG GUIDE COMPARISON:  Chest radiograph - 10/17/2020 MEDICATIONS: None. COMPLICATIONS: None immediate. TECHNIQUE: Informed written consent was obtained from the the patient's daughter after a discussion of the risks, benefits and alternatives to treatment. A timeout was performed prior to the initiation of the procedure. Initial ultrasound scanning demonstrates a large anechoic right-sided pleural effusion. The lower chest was  prepped and draped in the usual sterile fashion. 1% lidocaine was used for local anesthesia. An ultrasound image was saved for documentation purposes. An 8 Fr Safe-T-Centesis catheter was introduced. The thoracentesis was performed. The catheter was removed and a dressing was applied. The patient tolerated the procedure well without immediate post procedural complication. The patient was escorted to have an upright chest radiograph. FINDINGS: A total of approximately 1.2 liters of serous fluid was removed. Requested samples were sent to the laboratory. IMPRESSION: Successful ultrasound-guided right sided thoracentesis yielding 1.2 liters of pleural fluid. Electronically Signed   By: Sandi Mariscal M.D.   On: 10/18/2020 13:39       Subjective: Pt quiet, does respond to my questions.   Discharge Exam: Vitals:   10/18/20 0751 10/18/20 1140  BP: 92/60 104/66  Pulse: 91 100  Resp: 14 (!) 23  Temp:  97.8 F (36.6 C)  SpO2:     Vitals:   10/18/20 0502 10/18/20 0750 10/18/20 0751 10/18/20 1140  BP: 123/70 (!) 89/57 92/60 104/66  Pulse: 99 79 91 100  Resp: 18  14 (!) 23  Temp: 98.3 F (36.8 C) 98 F (36.7 C)  97.8 F (36.6 C)  TempSrc:  Oral  Oral  SpO2: 100% 91%    Weight:      Height:        General: Pt is alert, awake, not in acute distress Cardiovascular: RRR, S1/S2 +, no rubs, no gallops Respiratory: CTA bilaterally with decrease bs on right Abdominal: Soft, NT, ND, bowel sounds + Extremities: no edema, no cyanosis    The results of significant diagnostics from this hospitalization (including imaging, microbiology, ancillary and laboratory) are listed below for reference.     Microbiology: Recent Results (from the past 240 hour(s))  SARS CORONAVIRUS 2 (TAT 6-24 HRS) Nasopharyngeal Nasopharyngeal Swab     Status: None   Collection Time: 10/17/20  8:20 AM   Specimen: Nasopharyngeal Swab  Result Value Ref Range Status   SARS Coronavirus 2 NEGATIVE NEGATIVE Final    Comment:  (NOTE) SARS-CoV-2 target nucleic acids are NOT DETECTED.  The SARS-CoV-2 RNA is generally detectable in upper and lower respiratory specimens during the acute phase of infection. Negative results do not preclude SARS-CoV-2 infection, do not rule out co-infections with other pathogens, and should not be used as the sole basis for treatment or other patient management decisions. Negative results must be combined with clinical observations, patient history, and epidemiological information. The expected result is Negative.  Fact Sheet for Patients: SugarRoll.be  Fact Sheet for Healthcare Providers: https://www.woods-mathews.com/  This test is not yet approved or cleared by the [place] FDA and  has been authorized for detection and/or diagnosis of SARS-CoV-2 by FDA under an Emergency Use Authorization (EUA). This EUA will remain  in effect (meaning this test can be used) for the duration of the COVID-19 declaration under Se ction 564(b)(1) of the Act, 21 U.S.C. section 360bbb-3(b)(1), unless the authorization is terminated or revoked sooner.  Performed at [facility], [place] [address], [place], [place] [address]      Labs: BNP (last 3 results) Recent Labs    10/17/20 0819  BNP 196.2*   Basic Metabolic Panel: Recent Labs  Lab 10/17/20 0400 10/18/20 0441  NA 144 144  K 3.8 3.7  CL 102 105  CO2 28 28  GLUCOSE 192* 104*  BUN 42* 48*  CREATININE 1.10* 0.99  CALCIUM 9.2 8.7*   Liver Function Tests: Recent Labs  Lab 10/17/20 0400  AST 29  ALT 17  ALKPHOS 67  BILITOT 1.1  PROT 8.8*  ALBUMIN 2.9*   No results for input(s): LIPASE, AMYLASE in the last 168 hours. No results for input(s): AMMONIA in the last 168 hours. CBC: Recent Labs  Lab 10/17/20 0400 10/18/20 0441  WBC 10.0 14.0*  NEUTROABS 8.7*  --   HGB 15.0 14.4  HCT 47.8* 44.8  MCV 84.9 84.7  PLT 204 168   Cardiac Enzymes: No results for input(s):  CKTOTAL, CKMB, CKMBINDEX, TROPONINI in the last 168 hours. BNP: Invalid input(s): POCBNP CBG: Recent Labs  Lab 10/18/20 0744  GLUCAP 76   D-Dimer No results for input(s): DDIMER in the last 72 hours. Hgb A1c Recent Labs    10/18/20 0441  HGBA1C 5.8*   Lipid Profile Recent Labs    10/18/20 0441  CHOL 154  HDL 43  LDLCALC 90  TRIG 104  CHOLHDL 3.6   Thyroid function studies No results for input(s): TSH, T4TOTAL, T3FREE, THYROIDAB in the last 72 hours.  Invalid input(s): FREET3 Anemia work up No results for input(s): VITAMINB12, FOLATE, FERRITIN, TIBC, IRON, RETICCTPCT in the last 72 hours. Urinalysis    Component  Value Date/Time   COLORURINE YELLOW (A) 07/05/2020 0940   APPEARANCEUR CLOUDY (A) 07/05/2020 0940   LABSPEC 1.018 07/05/2020 0940   PHURINE 7.0 07/05/2020 0940   GLUCOSEU NEGATIVE 07/05/2020 0940   HGBUR NEGATIVE 07/05/2020 0940   BILIRUBINUR NEGATIVE 07/05/2020 0940   KETONESUR NEGATIVE 07/05/2020 0940   PROTEINUR NEGATIVE 07/05/2020 0940   NITRITE NEGATIVE 07/05/2020 0940   LEUKOCYTESUR NEGATIVE 07/05/2020 0940   Sepsis Labs Invalid input(s): PROCALCITONIN,  WBC,  LACTICIDVEN Microbiology Recent Results (from the past 240 hour(s))  SARS CORONAVIRUS 2 (TAT 6-24 HRS) Nasopharyngeal Nasopharyngeal Swab     Status: None   Collection Time: 10/17/20  8:20 AM   Specimen: Nasopharyngeal Swab  Result Value Ref Range Status   SARS Coronavirus 2 NEGATIVE NEGATIVE Final    Comment: (NOTE) SARS-CoV-2 target nucleic acids are NOT DETECTED.  The SARS-CoV-2 RNA is generally detectable in upper and lower respiratory specimens during the acute phase of infection. Negative results do not preclude SARS-CoV-2 infection, do not rule out co-infections with other pathogens, and should not be used as the sole basis for treatment or other patient management decisions. Negative results must be combined with clinical observations, patient history, and epidemiological  information. The expected result is Negative.  Fact Sheet for Patients: SugarRoll.be  Fact Sheet for Healthcare Providers: https://www.woods-mathews.com/  This test is not yet approved or cleared by the [place] FDA and  has been authorized for detection and/or diagnosis of SARS-CoV-2 by FDA under an Emergency Use Authorization (EUA). This EUA will remain  in effect (meaning this test can be used) for the duration of the COVID-19 declaration under Se ction 564(b)(1) of the Act, 21 U.S.C. section 360bbb-3(b)(1), unless the authorization is terminated or revoked sooner.  Performed at [facility], [place] [address], [place], [place] [address]      Time coordinating discharge: Over 30 minutes  SIGNED:   Nolberto Hanlon, MD  [facility] 10/18/2020, 2:07 PM Pager   If 7PM-7AM, please contact night-coverage [email or web address] Password [email or web address]

## 2020-10-18 NOTE — Procedures (Signed)
Pre procedural Dx: Symptomatic pleural effusion Post procedural Dx: Same  Successful US guided right sided thoracentesis yielding 1.3 L of serous pleural fluid.   Samples sent to lab for analysis.  EBL: None Complications: None immediate.  Ronny Bacon, MD Pager #: [phone]

## 2020-10-21 LAB — BODY FLUID CULTURE: Culture: NO GROWTH

## 2020-11-14 NOTE — ED Provider Notes (Signed)
Merlyn Lot, MD  Physician  Specialty:  Emergency Medicine  ED Triage Notes     Signed  Date of Service:  10/17/2020 7:18 AM         Procedure Orders  .Critical Care [[id number]] ordered by Merlyn Lot, MD      Signed      Expand All Collapse All    Show:Clear all [x] Manual[x] Template[] Copied  Added by: [x] Merlyn Lot, MD   [] Hover for details     [facility] Provider Note    Event Date/Time   First MD Initiated Contact with Patient 10/17/20 0701     (approximate)  I have reviewed the triage vital signs and the nursing notes.   HISTORY  Chief Complaint Shortness of Breath    HPI [name] is a 87 y.o. female with an extensive past medical history presents to the ER for worsening shortness of breath.  Patient very poor historian due to significant hearing difficulty.  Much of the story is obtained from the patient's daughter at bedside.  Reportedly is on hospice care and has uses oxygen intermittently but has had worsening shortness of breath of the past few days.  She is unable to sleep last night due to severe shortness of breath.  No report of any fevers or pain.       Past Medical History:  Diagnosis Date  . Anemia   . Arthritis   . Cataract cortical, senile   . CVD (cardiovascular disease)   . Hearing loss of both ears    wears hearing aides  . History of colonic polyps   . Hypertension   . Polycythemia   . Throat cancer ([place]) 2005  . Unsteady gait         Family History  Problem Relation Age of Onset  . Hypertension Mother   . Hypertension Sister   . Diabetes Mellitus II Sister         Past Surgical History:  Procedure Laterality Date  . JOINT REPLACEMENT Left    11-12 years ago secondary to some from children       Patient Active Problem List   Diagnosis Date Noted  . Pleural effusion on right 10/17/2020  . HLD (hyperlipidemia)  10/17/2020  . Acute on chronic respiratory failure with hypoxia ([place]) 10/17/2020  . Elevated troponin 10/17/2020  . Primary cancer of right upper lobe of lung ([place]) 06/30/2020  . Bilateral edema of lower extremity 01/10/2016  . Absolute anemia 10/20/2015  . Arthritis 10/20/2015  . Cataract cortical, senile 10/20/2015  . 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 Difficulty hearing 10/20/2015  . BP (high blood pressure) 10/20/2015  . History of colon polyps 10/20/2015  . Malignant neoplasm of pharynx ([place]) 10/20/2015             Prior to Admission medications   Medication Sig Start Date End Date Taking? Authorizing Provider  aspirin EC 81 MG tablet Take 81 mg by mouth daily. Swallow whole.   Yes [provider]  Morphine Sulfate (MORPHINE CONCENTRATE) 10 mg / 0.5 ml concentrated solution Take 10 mg by mouth every 4 (four) hours as needed for severe pain.   Yes [provider]    Allergies Atorvastatin    Social History Social History        Tobacco Use  . Smoking status: Former Smoker    Packs/day: 1.00    Years: 50.00    Pack years: 50.00    Types:  Cigarettes    Quit date: 09/17/2003    Years since quitting: 17.0  . Smokeless tobacco: Current User    Types: Snuff  Substance Use Topics  . Alcohol use: Yes    Alcohol/week: 5.0 standard drinks    Types: 5 Cans of beer per week    Comment: "beer"  . Drug use: No    Review of Systems Patient denies headaches, rhinorrhea, blurry vision, numbness, shortness of breath, chest pain, edema, cough, abdominal pain, nausea, vomiting, diarrhea, dysuria, fevers, rashes or hallucinations unless otherwise stated above in HPI. ____________________________________________   PHYSICAL EXAM:  VITAL SIGNS:     Vitals:   10/17/20 1030 10/17/20 1045  BP: 109/66   Pulse:  96  Resp:    Temp:    SpO2:  99%    Constitutional:  Alert and oriented.  Eyes: Conjunctivae are normal.  Head: Atraumatic. Nose: No congestion/rhinnorhea. Mouth/Throat: Mucous membranes are moist.   Neck: No stridor. Painless ROM.  Cardiovascular: Normal rate, regular rhythm. Grossly normal heart sounds.  Good peripheral circulation. Respiratory: mild tachypnea, diminished right sided bs Gastrointestinal: Soft and nontender. No distention. No abdominal bruits. No CVA tenderness. Genitourinary: deferred Musculoskeletal: No lower extremity tenderness nor edema.  No joint effusions. Neurologic:  Normal speech and language. No gross focal neurologic deficits are appreciated. No facial droop Skin:  Skin is warm, dry and intact. No rash noted. Psychiatric: Mood and affect are normal. Speech and behavior are normal.  ____________________________________________   LABS (all labs ordered are listed, but only abnormal results are displayed)  Lab Results Last 24 Hours       Results for orders placed or performed during the hospital encounter of 10/17/20 (from the past 24 hour(s))  Troponin I (High Sensitivity)     Status: Abnormal   Collection Time: 10/17/20  8:19 AM  Result Value Ref Range   Troponin I (High Sensitivity) 345 (HH) <18 ng/L  Brain natriuretic peptide     Status: Abnormal   Collection Time: 10/17/20  8:19 AM  Result Value Ref Range   B Natriuretic Peptide 195.5 (H) 0.0 - 100.0 pg/mL     ____________________________________________  EKG My review and personal interpretation at Time: 3:58   Indication: sob  Rate: 140  Rhythm: sinus Axis: unable to determine Other: abnml ekg, significant motion artifact ____________________________________________  RADIOLOGY  I personally reviewed all radiographic images ordered to evaluate for the above acute complaints and reviewed radiology reports and findings.  These findings were personally discussed with the patient.  Please see medical record for radiology  report.  ____________________________________________   PROCEDURES  Procedure(s) performed:  .Critical Care Performed by: Merlyn Lot, MD Authorized by: Merlyn Lot, MD   Critical care provider statement:    Critical care time (minutes):  15   Critical care time was exclusive of:  Separately billable procedures and treating other patients   Critical care was necessary to treat or prevent imminent or life-threatening deterioration of the following conditions:  Respiratory failure   Critical care was time spent personally by me on the following activities:  Development of treatment plan with patient or surrogate, discussions with consultants, evaluation of patient's response to treatment, examination of patient, obtaining history from patient or surrogate, ordering and performing treatments and interventions, ordering and review of laboratory studies, ordering and review of radiographic studies, pulse oximetry, re-evaluation of patient's condition and review of old charts      Critical Care performed: no ____________________________________________   INITIAL IMPRESSION / ASSESSMENT  AND PLAN / ED COURSE  Pertinent labs & imaging results that were available during my care of the patient were reviewed by me and considered in my medical decision making (see chart for details).  DDX: Asthma, copd, CHF, pna, ptx, malignancy, Pe, anemia   [name] is a 87 y.o. who presents to the ED with worsening shortness of breath presentation as described above.  Work-up shows evidence of interval development of large right pleural effusion which is likely causing the patient's acute hypoxia.  Troponin repeated is elevated but downtrending. .  She does not complain of any chest pain.  I do suspect this is demand ischemia in the setting of her hypoxia and pleural effusion.  BNP not significantly elevated.  Have a lower suspicion for acute heart failure or PE.  Will hold  off on heparinization at th is time given plan for thoracentesis and her goals of care are palliative. Patient is DNR but would like evaluation for thoracentesis to help with her dyspnea..  Will discuss with hospitalist for admission.   The patient was evaluated in Emergency Department today for the symptoms described in the history of present illness. He/she was evaluated in the context of the global COVID-19 pandemic, which necessitated consideration that the patient might be at risk for infection with the SARS-CoV-2 virus that causes COVID-19. Institutional protocols and algorithms that pertain to the evaluation of patients at risk for COVID-19 are in a state of rapid change based on information released by regulatory bodies including the CDC and federal and state organizations. These policies and algorithms were followed during the patient's care in the ED.  As part of my medical decision making, I reviewed the following data within the [facility] notes reviewed and incorporated, Labs reviewed, notes from prior ED visits and [place] Controlled Substance Database   ____________________________________________   FINAL CLINICAL IMPRESSION(S) / ED DIAGNOSES  Final diagnoses:  Pleural effusion  Acute respiratory failure with hypoxia ([place])      NEW MEDICATIONS STARTED DURING THIS VISIT:     New Prescriptions   No medications on file     Note:  This document was prepared using Dragon voice recognition software and may include unintentional dictation errors.    Merlyn Lot, MD 10/17/20 1153            Electronically signed by Merlyn Lot, MD at 10/17/2020 11:53 AM    ED to Hosp-Admission (Discharged) on 10/17/2020      ED to Hosp-Admission (Discharged) on 10/17/2020         Detailed Report       Note shared with patient     Clinical Impressions     Pleural effusion     Acute respiratory failure with  hypoxia [facility])    Disposition     Admit           IP After Visit Summary (Printed 10/18/2020)    Care Timeline  02/01    0653  Arrived  0718  .Critical Care  [phone]  ED EKG  0819  Brain natriuretic peptide     Troponin I (High Sensitivity)   0820  SARS CORONAVIRUS 2 (TAT 6-24 HRS) Nasopharyngeal Nasopharyngeal Swab  0926  Aspirin 324 mg  0948  Placed in Observation  1132  Acetaminophen 650 mg  2054  Transferred to [place]  2135  Protime-INR     APTT  02/02    9604  Basic metabolic panel  Lipid panel    Hemoglobin A1c     CBC   1245  US THORACENTESIS ASP PLEURAL SPACE W/IMG GUIDE      Merlyn Lot, MD 10/25/2020 1207

## 2020-11-14 DEATH — deceased

## 2022-08-13 IMAGING — CT CT HEAD W/O CM
3 of 6 series · 15 of 47 positions shown, 18 images · non-contrast
Comparison: May 2020

CLINICAL DATA: Weakness

EXAM:
CT HEAD WITHOUT CONTRAST
TECHNIQUE: Contiguous axial images were obtained from the base of the skull
through the vertex without intravenous contrast.

[Series 3: head wo · axial · 0.41mm/px · z∈[-115,+5]mm · 9 of 31 slices shown, 12 images]
[im 4/31  brain]
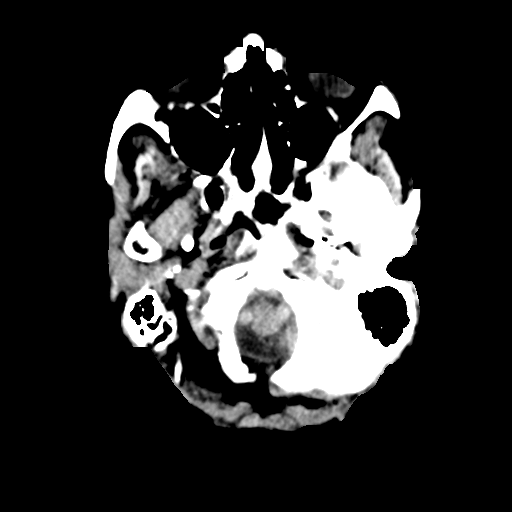
[im 4/31  bone]
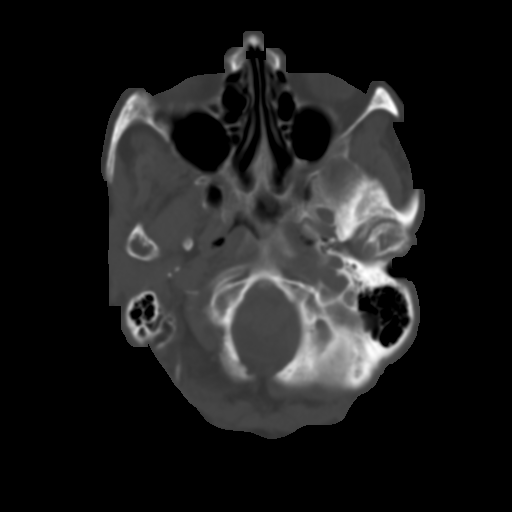
[im 7/31  brain]
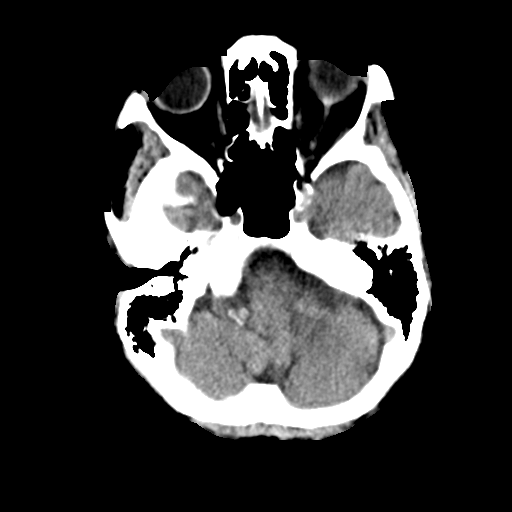
[im 10/31  brain]
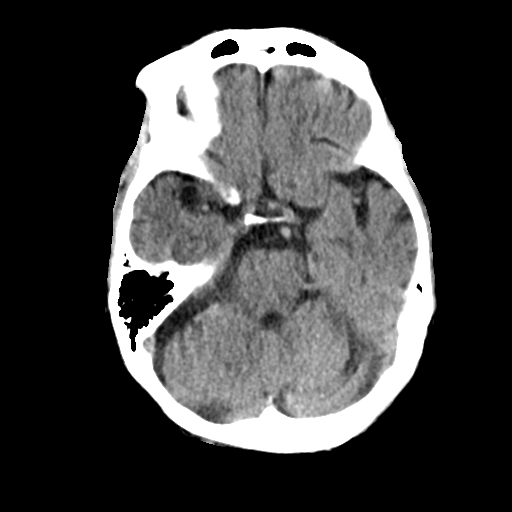
[im 13/31  brain]
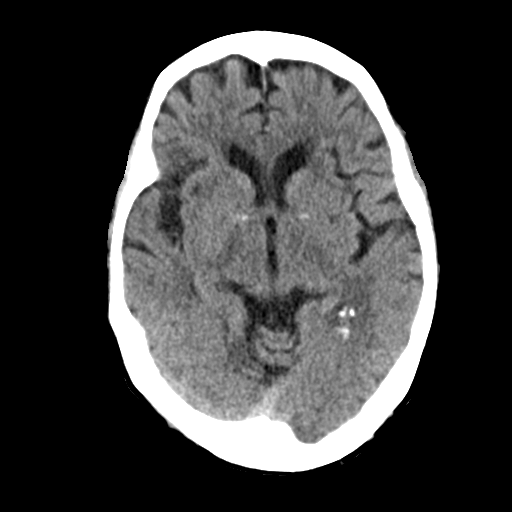
[im 16/31  brain]
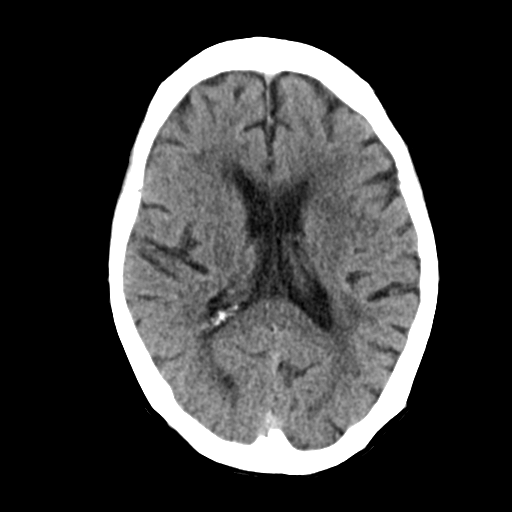
[im 16/31  bone]
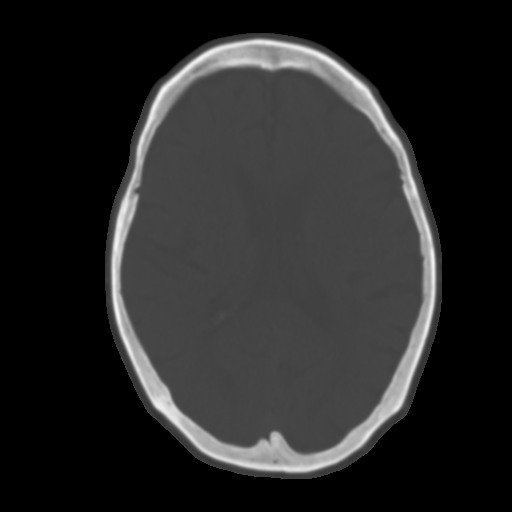
[im 19/31  brain]
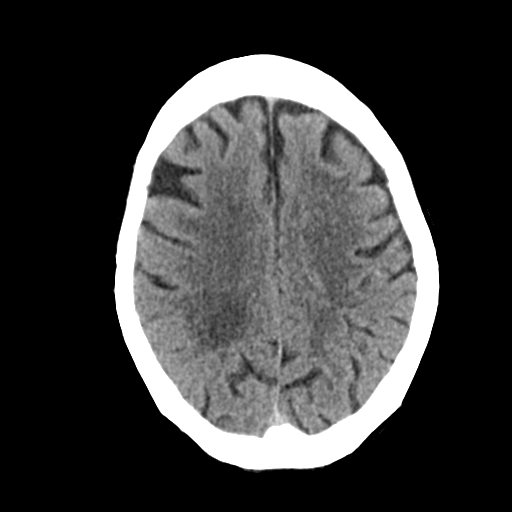
[im 22/31  brain]
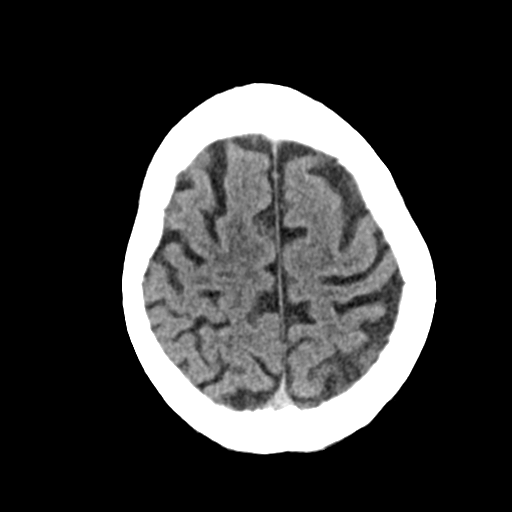
[im 25/31  brain]
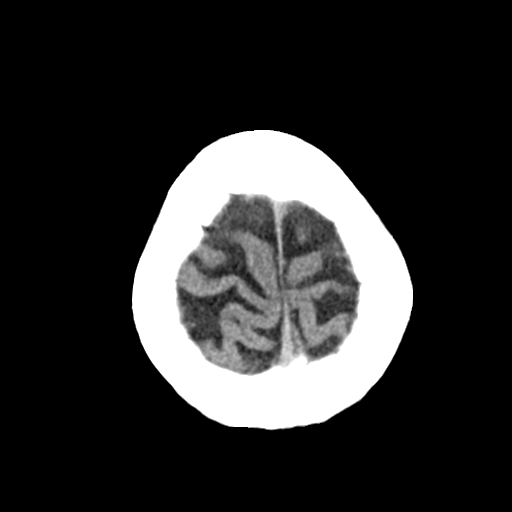
[im 28/31  brain]
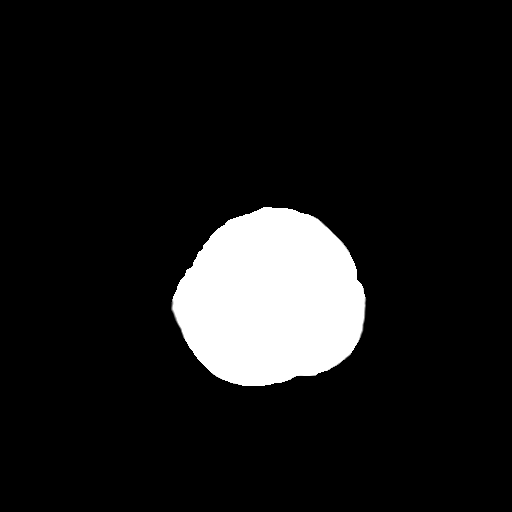
[im 28/31  bone]
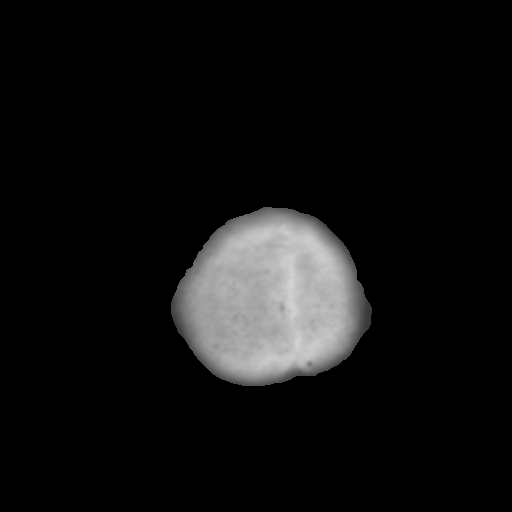

[Series 7: coronal soft tissue · coronal · 0.29mm/px · 3 of 61 slices shown]
[im 16/61  brain]
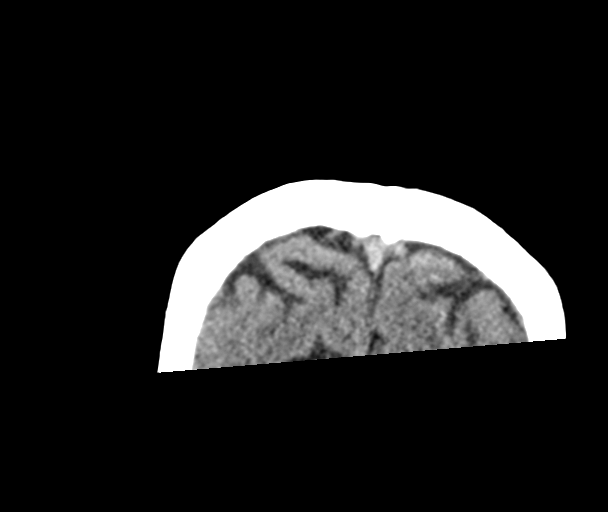
[im 31/61  brain]
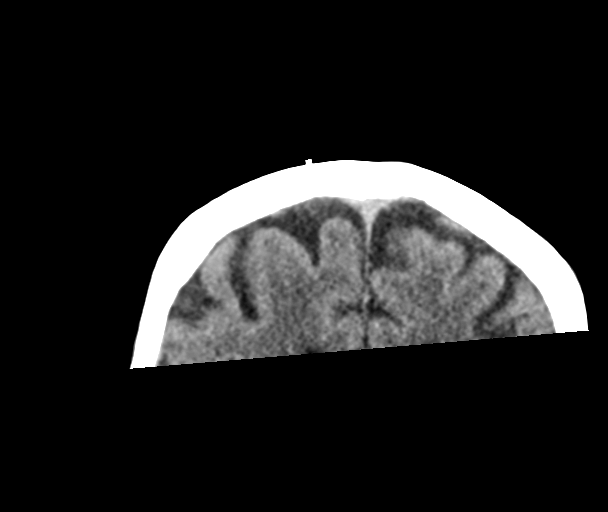
[im 46/61  brain]
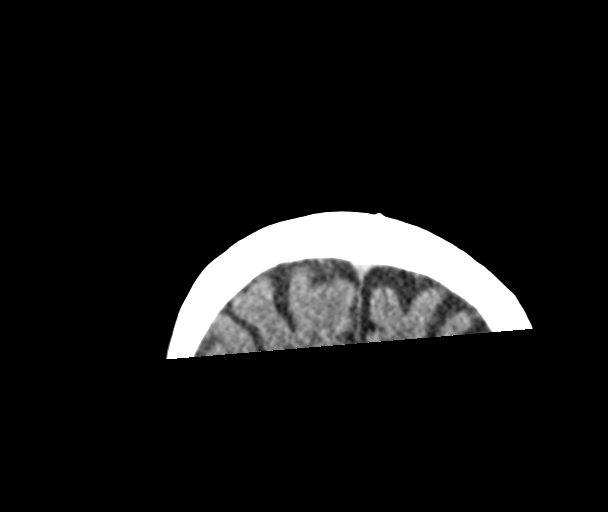

[Series 9: sagittal soft tissue · sagittal · 0.29mm/px · 3 of 50 slices shown]
[im 11/50  brain]
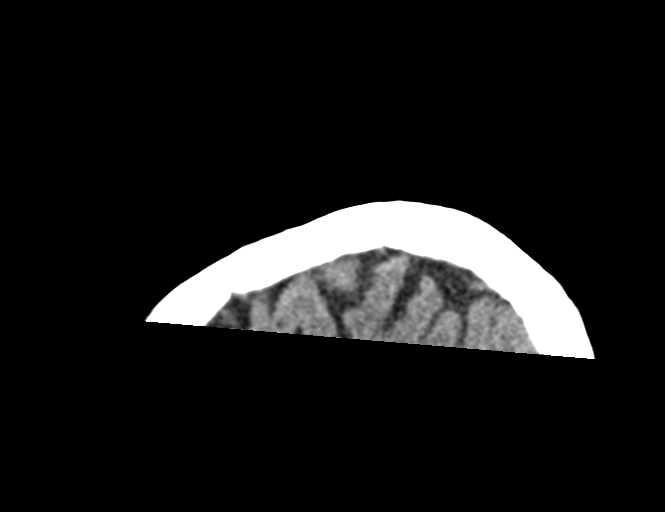
[im 21/50  brain]
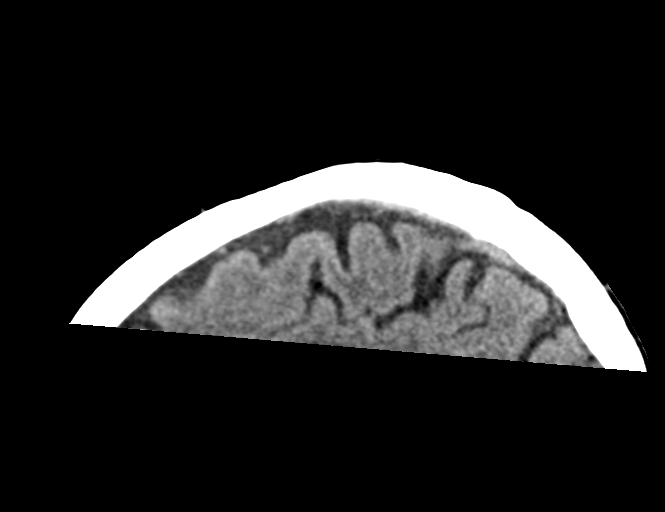
[im 31/50  brain]
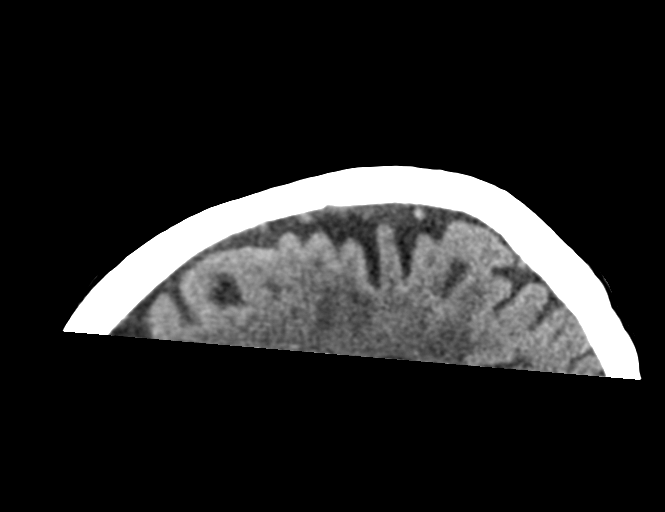

[15 of 47 positions shown; findings below may reference images not displayed]

FINDINGS: Brain: There is no acute intracranial hemorrhage, mass effect, or
edema. Gray-white differentiation is preserved. There is no
extra-axial fluid collection. Patchy and confluent areas of
hypoattenuation in the supratentorial white matter are nonspecific
but probably reflect moderate chronic microvascular ischemic
changes. Age indeterminate small vessel infarcts of the central
white matter. Chronic prominence of the ventricles and sulci
reflects mild generalized parenchymal volume loss.

Vascular: There is atherosclerotic calcification at the skull base.

Skull: Calvarium is unremarkable.

Sinuses/Orbits: No acute finding.

Other: None.
IMPRESSION: No acute intracranial hemorrhage or mass effect.

Age-indeterminate small vessel infarcts of the central white matter.

Moderate chronic microvascular ischemic changes.

## 2022-11-26 IMAGING — US US THORACENTESIS ASP PLEURAL SPACE W/IMG GUIDE
1 series · 3 of 3 positions shown · non-contrast
Comparison: Chest radiograph - 10/17/2020

MEDICATIONS:
None.

COMPLICATIONS:
None immediate.

INDICATION: Symptomatic right sided pleural effusion

EXAM:
US THORACENTESIS ASP PLEURAL SPACE W/IMG GUIDE
TECHNIQUE: Informed written consent was obtained from the the patient's
daughter after a discussion of the risks, benefits and alternatives
to treatment. A timeout was performed prior to the initiation of the
procedure.

[Series 1: us thoracentesis asp pleural s · 3 of 3 slices shown]
[im 1/3]
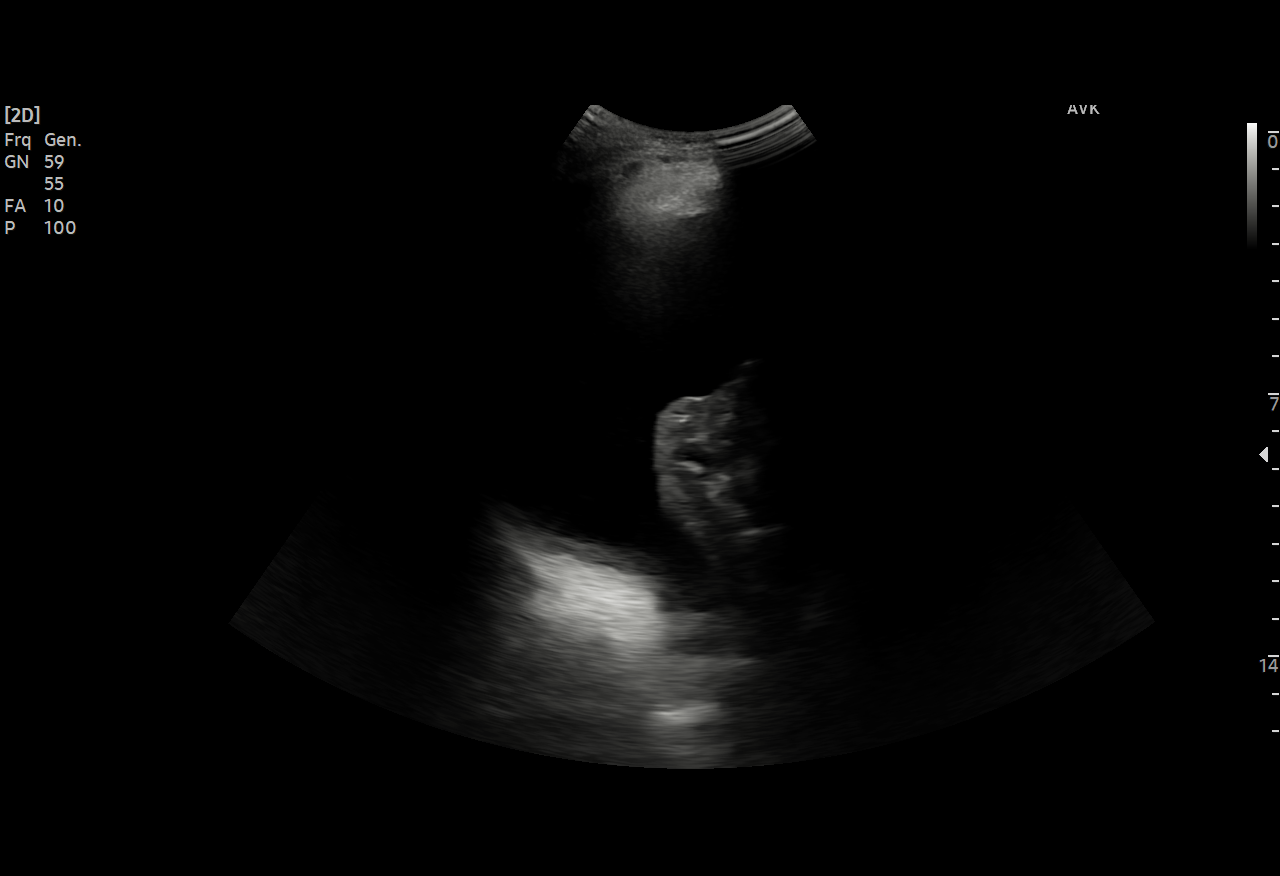
[im 2/3]
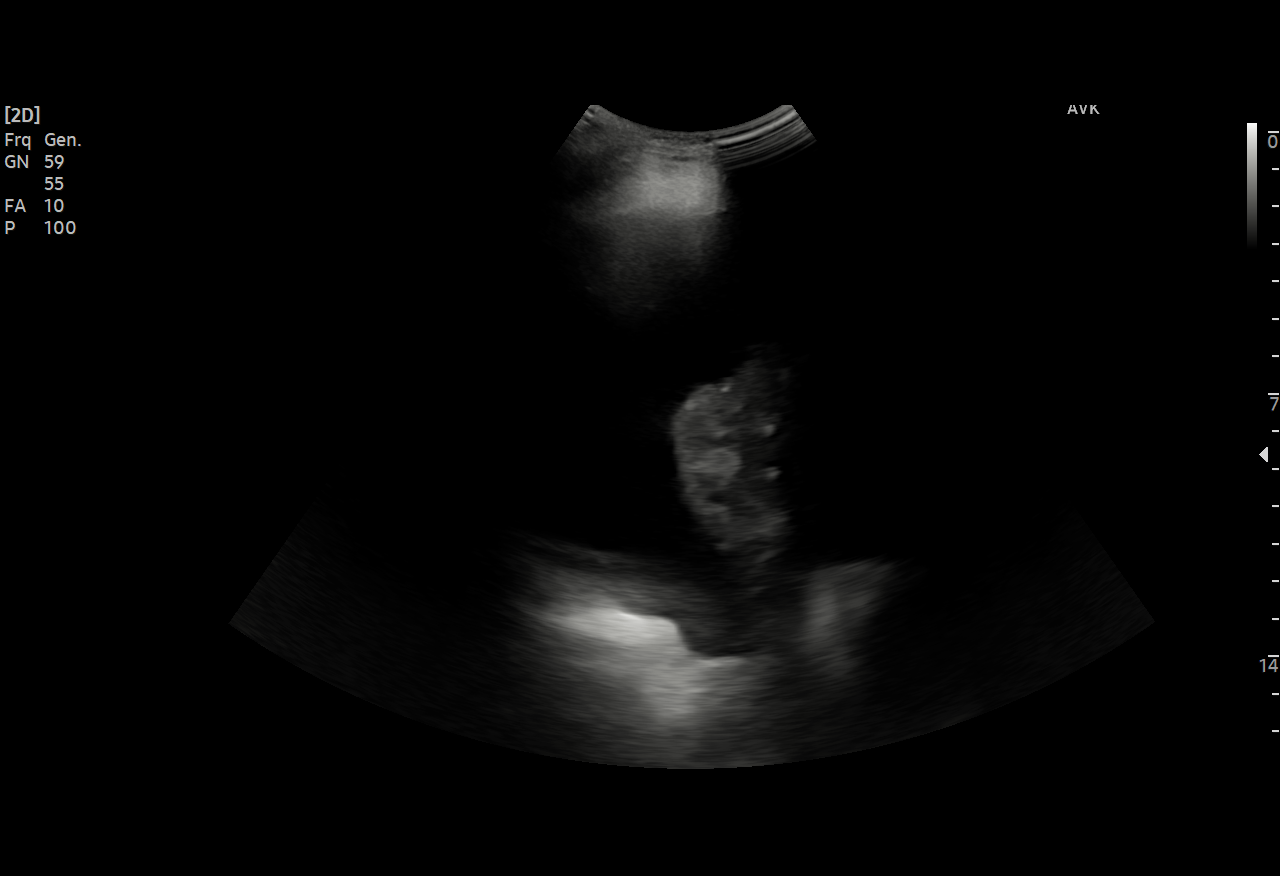
[im 3/3]
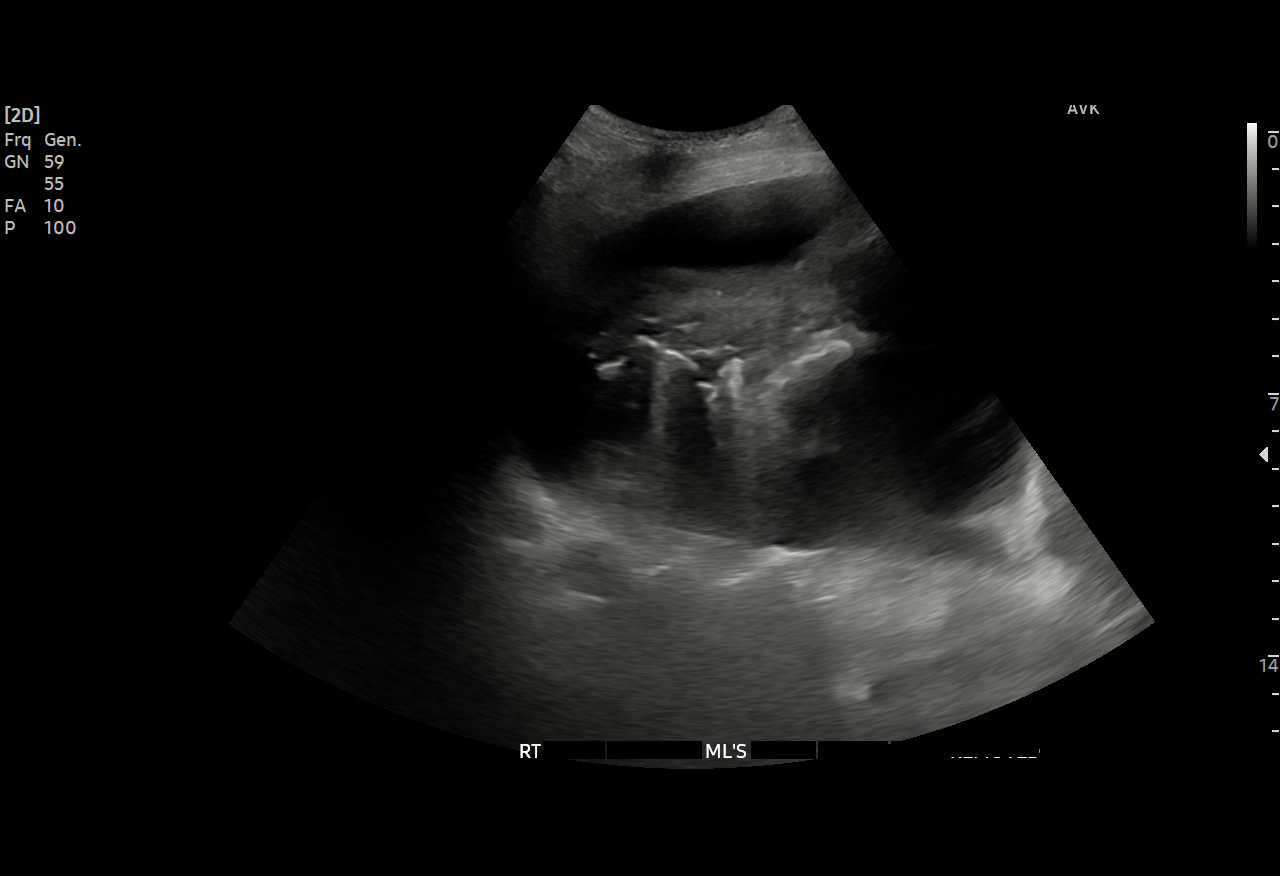

[3 of 3 positions shown; findings below may reference images not displayed]

Initial ultrasound scanning demonstrates a large anechoic
right-sided pleural effusion. The lower chest was prepped and draped
in the usual sterile fashion. 1% lidocaine was used for local
anesthesia. An ultrasound image was saved for documentation
purposes. An 8 Fr Safe-T-Centesis catheter was introduced. The
thoracentesis was performed. The catheter was removed and a dressing
was applied. The patient tolerated the procedure well without
immediate post procedural complication. The patient was escorted to
have an upright chest radiograph.
FINDINGS: A total of approximately 1.2 liters of serous fluid was removed.
Requested samples were sent to the laboratory.
IMPRESSION: Successful ultrasound-guided right sided thoracentesis yielding
liters of pleural fluid.
